# Patient Record
Sex: Male | Born: 1975 | Race: White | Hispanic: No | Marital: Single | Smoking: Current every day smoker
Health system: Southern US, Community
[De-identification: ages and names within clinical notes are randomized; demographics above are authoritative.]

## PROBLEM LIST (undated history)

## (undated) DIAGNOSIS — E039 Hypothyroidism, unspecified: Secondary | ICD-10-CM

## (undated) HISTORY — PX: NO PAST SURGERIES: SHX2092

---

## 1998-03-24 ENCOUNTER — Emergency Department (HOSPITAL_COMMUNITY): Admission: EM | Admit: 1998-03-24 | Discharge: 1998-03-24 | Payer: Self-pay | Admitting: Emergency Medicine

## 1998-03-24 ENCOUNTER — Encounter: Payer: Self-pay | Admitting: Emergency Medicine

## 2015-02-08 ENCOUNTER — Emergency Department (HOSPITAL_COMMUNITY)
Admission: EM | Admit: 2015-02-08 | Discharge: 2015-02-08 | Disposition: A | Discharge status: Home / Self Care | Payer: Self-pay | Attending: Emergency Medicine | Admitting: Emergency Medicine

## 2015-02-08 ENCOUNTER — Encounter (HOSPITAL_COMMUNITY): Payer: Self-pay | Admitting: *Deleted

## 2015-02-08 ENCOUNTER — Emergency Department (INDEPENDENT_AMBULATORY_CARE_PROVIDER_SITE_OTHER)
Admission: EM | Admit: 2015-02-08 | Discharge: 2015-02-08 | Disposition: A | Discharge status: Other Acute Inpatient Hospital | Payer: Self-pay | Source: Home / Self Care | Attending: Family Medicine | Admitting: Family Medicine

## 2015-02-08 ENCOUNTER — Emergency Department (HOSPITAL_BASED_OUTPATIENT_CLINIC_OR_DEPARTMENT_OTHER)
Admit: 2015-02-08 | Discharge: 2015-02-08 | Disposition: A | Discharge status: Home / Self Care | Payer: Self-pay | Attending: Emergency Medicine | Admitting: Emergency Medicine

## 2015-02-08 DIAGNOSIS — R609 Edema, unspecified: Secondary | ICD-10-CM

## 2015-02-08 DIAGNOSIS — I82403 Acute embolism and thrombosis of unspecified deep veins of lower extremity, bilateral: Secondary | ICD-10-CM

## 2015-02-08 DIAGNOSIS — I82811 Embolism and thrombosis of superficial veins of right lower extremities: Secondary | ICD-10-CM

## 2015-02-08 DIAGNOSIS — R600 Localized edema: Secondary | ICD-10-CM | POA: Insufficient documentation

## 2015-02-08 DIAGNOSIS — I82401 Acute embolism and thrombosis of unspecified deep veins of right lower extremity: Secondary | ICD-10-CM | POA: Insufficient documentation

## 2015-02-08 DIAGNOSIS — Z791 Long term (current) use of non-steroidal anti-inflammatories (NSAID): Secondary | ICD-10-CM | POA: Insufficient documentation

## 2015-02-08 DIAGNOSIS — Z720 Tobacco use: Secondary | ICD-10-CM | POA: Insufficient documentation

## 2015-02-08 DIAGNOSIS — M791 Myalgia: Secondary | ICD-10-CM | POA: Insufficient documentation

## 2015-02-08 LAB — I-STAT CHEM 8, ED
BUN: 11 mg/dL (ref 6–20)
Calcium, Ion: 1.14 mmol/L (ref 1.12–1.23)
Chloride: 101 mmol/L (ref 101–111)
Creatinine, Ser: 0.9 mg/dL (ref 0.61–1.24)
Glucose, Bld: 114 mg/dL — ABNORMAL HIGH (ref 65–99)
HCT: 45 % (ref 39.0–52.0)
Hemoglobin: 15.3 g/dL (ref 13.0–17.0)
Potassium: 4.1 mmol/L (ref 3.5–5.1)
Sodium: 138 mmol/L (ref 135–145)
TCO2: 24 mmol/L (ref 0–100)

## 2015-02-08 MED ORDER — ASPIRIN 81 MG PO CHEW: 81.0000 mg | CHEWABLE_TABLET | Freq: Every day | ORAL | Status: DC

## 2015-02-08 NOTE — Progress Notes (Signed)
VASCULAR LAB PRELIMINARY  PRELIMINARY  PRELIMINARY  PRELIMINARY  Bilateral lower extremity venous duplex completed.    Preliminary report:  There is no DVT noted in the bilateral lower extremities.   There is superficial thrombosis noted in the lesser saphenous vein of the right lower extremity, distal to proximal calf.  There is an anechoic area noted in the left popliteal fossa that could possibly be consistent with a rupturing Baker's cyst as there is, what appears to be, significant fluid throughout the calf.  Britania Shreeve, RVT 02/08/2015, 6:30 PM

## 2015-02-08 NOTE — ED Provider Notes (Signed)
CSN: [id number]     Arrival date & time 02/08/15  1655 History  This chart was scribed for Ripley Fraise, MD by Ludger Nutting, ED Scribe. This patient was seen in room TR04C/TR04C and the patient's care was started 5:41 PM.    Chief Complaint  Patient presents with  . Leg Swelling   The history is provided by the patient. No language interpreter was used.     HPI Comments: Matthew Flynn is a 38 y.o. male who presents to the Emergency Department complaining of a few months of intermittent pain and swelling to the bilateral legs, left greater than right. He reports the pain is constant and gradually worsening to the back of the left knee and calf for the past couple of days. He states the pain is worse with bearing weight. He was seen at [facility] and was sent here for Doppler ultrasound testing. He denies fever, vomiting, CP, SOB, abdominal pain. He is a current daily smoker.   History reviewed. No pertinent past medical history. History reviewed. No pertinent past surgical history. History reviewed. No pertinent family history. Social History  Substance Use Topics  . Smoking status: Current Every Day Smoker  . Smokeless tobacco: None  . Alcohol Use: Yes    Review of Systems  Constitutional: Negative for fever.  Respiratory: Negative for shortness of breath.   Cardiovascular: Positive for leg swelling. Negative for chest pain.  Gastrointestinal: Negative for vomiting and abdominal pain.  Musculoskeletal: Positive for myalgias (left calf).  Neurological: Negative for weakness and numbness.    Allergies  Review of patient's allergies indicates no known allergies.  Home Medications   Prior to Admission medications   Medication Sig Start Date End Date Taking? Authorizing Provider  Naproxen Sodium (ALEVE PO) Take by mouth.    Historical Provider, MD   There were no vitals taken for this visit. Physical Exam  Nursing note and vitals reviewed.   CONSTITUTIONAL: Well  developed/well nourished HEAD: Normocephalic/atraumatic EYES: EOMI/PERRL ENMT: Mucous membranes moist NECK: supple no meningeal signs SPINE/BACK:entire spine nontender CV: S1/S2 noted, no murmurs/rubs/gallops noted LUNGS: Lungs are clear to auscultation bilaterally, no apparent distress ABDOMEN: soft, nontender, no rebound or guarding, bowel sounds noted throughout abdomen GU:no cva tenderness NEURO: Pt is awake/alert/appropriate, moves all extremitiesx4.  No facial droop.   EXTREMITIES: pulses normal/equal, full ROM, significant edema and calf tenderness to left leg. Mild edema to right leg. Distal cap refill < 3 seconds  SKIN: warm, color normal PSYCH: no abnormalities of mood noted, alert and oriented to situation  ED Course  Procedures   DIAGNOSTIC STUDIES: Oxygen Saturation is 96% on RA, adequate by my interpretation.    COORDINATION OF CARE: 5:46 PM Will order Doppler ultrasound study and lab work. Discussed treatment plan with pt at bedside and pt agreed to plan.   Labs Review Labs Reviewed  I-STAT CHEM 8, ED - Abnormal; Notable for the following:    Glucose, Bld 114 (*)    All other components within normal limits   No DVT Small superficial VT in right LEG Possible ruptured bakers cyst to left LE Distal pulses intact Pt only has pain in LEFT calf Advised rest, elevate leg and also use of crutches Will start ASA for SVT Given PCP referral   MDM   Final diagnoses:  Peripheral edema  Acute superficial venous thrombosis of lower extremity, right    Nursing notes including past medical history and social history reviewed and considered in documentation Labs/vital reviewed  myself and considered during evaluation   I personally performed the services described in this documentation, which was scribed in my presence. The recorded information has been reviewed and is accurate.       Ripley Fraise, MD 02/08/15 [phone]

## 2015-02-08 NOTE — ED Notes (Signed)
Pt reports having intermittent leg swelling for months. More severe in left leg. Has pain to calf and behind his knees. Went to ucc and sent here to r/o dvt

## 2015-02-08 NOTE — ED Notes (Signed)
Swelling noted to LLE. Warm to touch. Tender to touch. Pulses palpable. Cap refill <3.

## 2015-02-08 NOTE — ED Notes (Signed)
Pt  Has  Swelling  Of  Both  Legs        He  Reports         l           Leg  Is  Worse  Than the  r         He  denys  Any  Injury  He  Reports  Pain is  Worse  On  Weight bearing             He  denys  Any  Chest pain or  Any  Shortness of  Breath

## 2015-02-08 NOTE — ED Provider Notes (Signed)
CSN: [id number]     Arrival date & time 02/08/15  1408 History   First MD Initiated Contact with Patient 02/08/15 1551     Chief Complaint  Patient presents with  . Leg Swelling   (Consider location/radiation/quality/duration/timing/severity/associated sxs/prior Treatment) Patient is a 39 y.o. male presenting with leg pain. The history is provided by the patient.  Leg Pain Location:  Leg Leg location:  L lower leg and R lower leg Pain details:    Quality:  Pressure and cramping   Radiates to:  Does not radiate   Severity:  Moderate (left> right sx.)   Onset quality:  Gradual   Duration:  3 months   Progression:  Worsening Chronicity:  Chronic Dislocation: no   Prior injury to area:  No Associated symptoms: decreased ROM, muscle weakness and swelling     History reviewed. No pertinent past medical history. History reviewed. No pertinent past surgical history. History reviewed. No pertinent family history. Social History  Substance Use Topics  . Smoking status: Current Every Day Smoker  . Smokeless tobacco: None  . Alcohol Use: Yes    Review of Systems  Constitutional: Negative.   Respiratory: Negative.   Cardiovascular: Positive for leg swelling. Negative for chest pain and palpitations.  Gastrointestinal: Negative.     Allergies  Review of patient's allergies indicates no known allergies.  Home Medications   Prior to Admission medications   Medication Sig Start Date End Date Taking? Authorizing Provider  Naproxen Sodium (ALEVE PO) Take by mouth.   Yes Historical Provider, MD   BP 127/69 mmHg  Pulse 83  Temp(Src) 98.8 F (37.1 C) (Oral)  Resp 18  SpO2 96% Physical Exam  Constitutional: He is oriented to person, place, and time. He appears well-developed and well-nourished. No distress.  Cardiovascular: Normal heart sounds and intact distal pulses.   Pulmonary/Chest: Effort normal and breath sounds normal.  Musculoskeletal: He exhibits edema.       Left  lower leg: He exhibits tenderness, swelling and edema.       Legs: Neurological: He is alert and oriented to person, place, and time.  Skin: Skin is warm and dry.  Nursing note and vitals reviewed.   ED Course  Procedures (including critical care time) Labs Review Labs Reviewed - No data to display  Imaging Review No results found.   MDM   1. DVT (deep venous thrombosis), bilateral    Sent for eval of poss dvt , no risk factors except smoking.    Billy Fischer, MD 02/08/15 [phone]

## 2018-11-28 DIAGNOSIS — M25562 Pain in left knee: Secondary | ICD-10-CM | POA: Insufficient documentation

## 2018-11-28 DIAGNOSIS — M25561 Pain in right knee: Secondary | ICD-10-CM | POA: Insufficient documentation

## 2021-06-26 DIAGNOSIS — K4090 Unilateral inguinal hernia, without obstruction or gangrene, not specified as recurrent: Secondary | ICD-10-CM

## 2021-06-26 HISTORY — DX: Unilateral inguinal hernia, without obstruction or gangrene, not specified as recurrent: K40.90

## 2021-07-12 ENCOUNTER — Encounter: Payer: Self-pay | Admitting: Nurse Practitioner

## 2021-07-12 ENCOUNTER — Other Ambulatory Visit: Payer: Self-pay

## 2021-07-12 ENCOUNTER — Ambulatory Visit (INDEPENDENT_AMBULATORY_CARE_PROVIDER_SITE_OTHER): Payer: Managed Care, Other (non HMO) | Admitting: Nurse Practitioner

## 2021-07-12 VITALS — BP 105/67 | HR 80 | Temp 98.0°F | Ht 74.0 in | Wt 191.0 lb

## 2021-07-12 DIAGNOSIS — Z13228 Encounter for screening for other metabolic disorders: Secondary | ICD-10-CM

## 2021-07-12 DIAGNOSIS — Z0000 Encounter for general adult medical examination without abnormal findings: Secondary | ICD-10-CM

## 2021-07-12 DIAGNOSIS — Z1329 Encounter for screening for other suspected endocrine disorder: Secondary | ICD-10-CM

## 2021-07-12 DIAGNOSIS — K4091 Unilateral inguinal hernia, without obstruction or gangrene, recurrent: Secondary | ICD-10-CM | POA: Insufficient documentation

## 2021-07-12 DIAGNOSIS — Z7689 Persons encountering health services in other specified circumstances: Secondary | ICD-10-CM

## 2021-07-12 DIAGNOSIS — Z130 Encounter for screening for diseases of the blood and blood-forming organs and certain disorders involving the immune mechanism: Secondary | ICD-10-CM

## 2021-07-12 DIAGNOSIS — R7989 Other specified abnormal findings of blood chemistry: Secondary | ICD-10-CM | POA: Diagnosis not present

## 2021-07-12 DIAGNOSIS — Z1321 Encounter for screening for nutritional disorder: Secondary | ICD-10-CM

## 2021-07-12 DIAGNOSIS — M26609 Unspecified temporomandibular joint disorder, unspecified side: Secondary | ICD-10-CM | POA: Diagnosis not present

## 2021-07-12 MED ORDER — CYCLOBENZAPRINE HCL 10 MG PO TABS: ORAL_TABLET | ORAL | 1 refills | Status: DC

## 2021-07-12 NOTE — Progress Notes (Signed)
New Patient Office Visit  Subjective:  Patient ID: Matthew Flynn, male    DOB: Feb 24, 1976  Age: 46 y.o. MRN: [id number]  CC:  Chief Complaint  Patient presents with   New Patient (Initial Visit)    HPI Matthew Flynn presents to establish new primary care provider. The patient used to be on testosterone injections. States that he has not had testosterone injection in two months. Was taking them once monthly. States that last testosterone level was probably checked last year.  Has recently had Baker's cyst behind the right knee. He states that this has resolved. He does get routine DOT physicals. Has not had routine, fasting blood work. The patient is fasting.  He states that for past few weeks, the right side of his jaw feels tight. It is difficult for him to clench his teeth first thing in the morning. He denies his teeth. He denies increased levels of stress or clenching his jaw during the day. States that as a child, he used to have problems with jaw popping  Has left inguinal hernia. Currently not bothering him. Gets larger when he heavily exerts himself.   History reviewed. No pertinent past medical history.  History reviewed. No pertinent surgical history.  Family History  Problem Relation Age of Onset   Diabetes Mother    Stroke Maternal Grandmother    Stroke Maternal Grandfather     Social History   Socioeconomic History   Marital status: Single    Spouse name: Not on file   Number of children: Not on file   Years of education: Not on file   Highest education level: Not on file  Occupational History   Not on file  Tobacco Use   Smoking status: Never   Smokeless tobacco: Not on file  Substance and Sexual Activity   Alcohol use: Yes   Drug use: Never   Sexual activity: Yes  Other Topics Concern   Not on file  Social History Narrative   Not on file   Social Determinants of Health   Financial Resource Strain: Not on file  Food Insecurity: Not on file   Transportation Needs: Not on file  Physical Activity: Not on file  Stress: Not on file  Social Connections: Not on file  Intimate Partner Violence: Not on file    ROS Review of Systems  Constitutional:  Positive for fatigue. Negative for activity change, chills and fever.  HENT:  Negative for congestion, postnasal drip, rhinorrhea, sinus pressure, sinus pain, sneezing and sore throat.   Eyes: Negative.   Respiratory:  Negative for cough, shortness of breath and wheezing.   Cardiovascular:  Negative for chest pain and palpitations.  Gastrointestinal:  Negative for constipation, diarrhea, nausea and vomiting.  Endocrine: Negative for cold intolerance, heat intolerance, polydipsia and polyuria.  Genitourinary:  Positive for scrotal swelling and testicular pain. Negative for dysuria, frequency and urgency.       Left inguinal hernia. Will cause pain with extreme exertion. Stretches into left testicular area which becomes very tender if inguinal hernia becomes inflamed.   Musculoskeletal:  Negative for back pain and myalgias.  Skin:  Negative for rash.  Allergic/Immunologic: Negative for environmental allergies.  Neurological:  Negative for dizziness, weakness and headaches.  Psychiatric/Behavioral:  The patient is not nervous/anxious.    Objective:   Today's Vitals   07/12/21 0846  BP: 105/67  Pulse: 80  Temp: 98 F (36.7 C)  SpO2: 97%  Weight: 191 lb (86.6 kg)  Height:  6' 2"  (1.88 m)   Body mass index is 24.52 kg/m.   Physical Exam Vitals and nursing note reviewed.  Constitutional:      Appearance: Normal appearance. He is well-developed.  HENT:     Head: Normocephalic and atraumatic.     Jaw: Tenderness, pain on movement and malocclusion present.     Nose: Nose normal.     Mouth/Throat:     Mouth: Mucous membranes are moist.     Pharynx: Oropharynx is clear.  Eyes:     Pupils: Pupils are equal, round, and reactive to light.  Cardiovascular:     Rate and Rhythm:  Normal rate and regular rhythm.     Pulses: Normal pulses.     Heart sounds: Normal heart sounds.  Pulmonary:     Effort: Pulmonary effort is normal.     Breath sounds: Normal breath sounds.  Abdominal:     General: Bowel sounds are normal.     Palpations: Abdomen is soft.     Tenderness: There is no abdominal tenderness.     Hernia: A hernia is present. Hernia is present in the left inguinal area.  Genitourinary:   Musculoskeletal:        General: Normal range of motion.     Cervical back: Normal range of motion and neck supple.  Lymphadenopathy:     Cervical: No cervical adenopathy.  Skin:    General: Skin is warm and dry.     Capillary Refill: Capillary refill takes less than 2 seconds.  Neurological:     General: No focal deficit present.     Mental Status: He is alert and oriented to person, place, and time.  Psychiatric:        Mood and Affect: Mood normal.        Behavior: Behavior normal.        Thought Content: Thought content normal.        Judgment: Judgment normal.    Assessment & Plan:  1. Encounter to establish care Appointment today to establish new primary care provider . Will get records from previous primary care provider to review and update patient chart.   2. Unilateral recurrent inguinal hernia without obstruction or gangrene Large left inguinal hernia present. Consider referral to general surgery at next visit   3. TMJ (temporomandibular joint disorder) Trial flexeril - take at bedtime as needed for muscle pain and spasms. Advised him to take only at bedtime. He should not drive or work after taking this medication as it will likely cause drowsiness and dizziness. He voiced understanding and agreement.  - cyclobenzaprine (FLEXERIL) 10 MG tablet; Take 1/2 to 1 tablet po QHS prn TMJ  Dispense: 30 tablet; Refill: 1  4. Low testosterone in male Check testosteron and prolactin levels for further evaluation. Will treat as indicated.  - Testosterone;  Future - Prolactin; Future - Testosterone - Prolactin  5. Screening for endocrine, nutritional, metabolic and immunity disorder Routine, fasting labs drawn during today's visit  - CBC; Future - Lipid panel; Future - TSH; Future - Comp Met (CMET); Future - Testosterone; Future - Hemoglobin A1c; Future - Prolactin; Future - Hemoglobin A1c - Testosterone - Comp Met (CMET) - TSH - Lipid panel - CBC - Prolactin  6. Health care maintenance Routine, fasting labs drawn during today's visit  - CBC; Future - Lipid panel; Future - TSH; Future - Comp Met (CMET); Future - Hemoglobin A1c; Future - Hemoglobin A1c - Comp Met (CMET) - TSH - Lipid panel -  CBC    Problem List Items Addressed This Visit       Musculoskeletal and Integument   TMJ (temporomandibular joint disorder)   Relevant Medications   cyclobenzaprine (FLEXERIL) 10 MG tablet     Other   Unilateral recurrent inguinal hernia without obstruction or gangrene   Low testosterone in male   Relevant Orders   Testosterone   Prolactin   Other Visit Diagnoses     Encounter to establish care    -  Primary   Screening for endocrine, nutritional, metabolic and immunity disorder       Relevant Orders   CBC   Lipid panel   TSH   Comp Met (CMET)   Testosterone   Hemoglobin A1c   Prolactin   Health care maintenance       Relevant Orders   CBC   Lipid panel   TSH   Comp Met (CMET)   Hemoglobin A1c       Outpatient Encounter Medications as of 07/12/2021  Medication Sig   cyclobenzaprine (FLEXERIL) 10 MG tablet Take 1/2 to 1 tablet po QHS prn TMJ   [DISCONTINUED] aspirin 81 MG chewable tablet Chew 1 tablet (81 mg total) by mouth daily. (Patient not taking: Reported on 07/12/2021)   [DISCONTINUED] Naproxen Sodium (ALEVE PO) Take by mouth. (Patient not taking: Reported on 07/12/2021)   No facility-administered encounter medications on file as of 07/12/2021.    Follow-up: Return in about 3 weeks (around 08/02/2021)  for health maintenance exam  - can we get records from [facility]? thanks .   Ronnell Freshwater, NP

## 2021-07-13 LAB — TESTOSTERONE: Testosterone: 371 ng/dL (ref 264–916)

## 2021-07-13 LAB — COMPREHENSIVE METABOLIC PANEL
ALT: 16 IU/L (ref 0–44)
AST: 14 IU/L (ref 0–40)
Albumin/Globulin Ratio: 2.1 (ref 1.2–2.2)
Albumin: 4.5 g/dL (ref 4.0–5.0)
Alkaline Phosphatase: 70 IU/L (ref 44–121)
BUN/Creatinine Ratio: 13 (ref 9–20)
BUN: 11 mg/dL (ref 6–24)
Bilirubin Total: 0.2 mg/dL (ref 0.0–1.2)
CO2: 24 mmol/L (ref 20–29)
Calcium: 9.3 mg/dL (ref 8.7–10.2)
Chloride: 101 mmol/L (ref 96–106)
Creatinine, Ser: 0.88 mg/dL (ref 0.76–1.27)
Globulin, Total: 2.1 g/dL (ref 1.5–4.5)
Glucose: 96 mg/dL (ref 70–99)
Potassium: 4.2 mmol/L (ref 3.5–5.2)
Sodium: 139 mmol/L (ref 134–144)
Total Protein: 6.6 g/dL (ref 6.0–8.5)
eGFR: 109 mL/min/{1.73_m2} (ref >59)

## 2021-07-13 LAB — CBC
Hematocrit: 44.5 % (ref 37.5–51.0)
Hemoglobin: 14.8 g/dL (ref 13.0–17.7)
MCH: 30.6 pg (ref 26.6–33.0)
MCHC: 33.3 g/dL (ref 31.5–35.7)
MCV: 92 fL (ref 79–97)
Platelets: 245 10*3/uL (ref 150–450)
RBC: 4.84 x10E6/uL (ref 4.14–5.80)
RDW: 12.9 % (ref 11.6–15.4)
WBC: 9.3 10*3/uL (ref 3.4–10.8)

## 2021-07-13 LAB — TSH: TSH: 5.54 u[IU]/mL — ABNORMAL HIGH (ref 0.450–4.500)

## 2021-07-13 LAB — LIPID PANEL
Chol/HDL Ratio: 3.8 ratio (ref 0.0–5.0)
Cholesterol, Total: 162 mg/dL (ref 100–199)
HDL: 43 mg/dL (ref >39)
LDL Chol Calc (NIH): 83 mg/dL (ref 0–99)
Triglycerides: 218 mg/dL — ABNORMAL HIGH (ref 0–149)
VLDL Cholesterol Cal: 36 mg/dL (ref 5–40)

## 2021-07-13 LAB — PROLACTIN: Prolactin: 20.8 ng/mL — ABNORMAL HIGH (ref 4.0–15.2)

## 2021-07-13 LAB — HEMOGLOBIN A1C
Est. average glucose Bld gHb Est-mCnc: 114 mg/dL
Hgb A1c MFr Bld: 5.6 % (ref 4.8–5.6)

## 2021-07-13 NOTE — Progress Notes (Signed)
Elevated TSH and elevated prolactin levels with increased fatigue and decreased libido. Will discuss with patient. Will need to be referred to endocrinology. Discuss MRI. Next visit 08/02/2021

## 2021-08-02 ENCOUNTER — Other Ambulatory Visit: Payer: Self-pay

## 2021-08-02 ENCOUNTER — Encounter: Payer: Self-pay | Admitting: Nurse Practitioner

## 2021-08-02 ENCOUNTER — Ambulatory Visit (INDEPENDENT_AMBULATORY_CARE_PROVIDER_SITE_OTHER): Payer: Managed Care, Other (non HMO) | Admitting: Nurse Practitioner

## 2021-08-02 VITALS — BP 101/65 | HR 97 | Temp 98.3°F | Ht 74.0 in | Wt 195.0 lb

## 2021-08-02 DIAGNOSIS — R7989 Other specified abnormal findings of blood chemistry: Secondary | ICD-10-CM | POA: Diagnosis not present

## 2021-08-02 DIAGNOSIS — E039 Hypothyroidism, unspecified: Secondary | ICD-10-CM | POA: Insufficient documentation

## 2021-08-02 DIAGNOSIS — Z0001 Encounter for general adult medical examination with abnormal findings: Secondary | ICD-10-CM | POA: Diagnosis not present

## 2021-08-02 DIAGNOSIS — K4091 Unilateral inguinal hernia, without obstruction or gangrene, recurrent: Secondary | ICD-10-CM | POA: Diagnosis not present

## 2021-08-02 DIAGNOSIS — E663 Overweight: Secondary | ICD-10-CM

## 2021-08-02 DIAGNOSIS — M26609 Unspecified temporomandibular joint disorder, unspecified side: Secondary | ICD-10-CM

## 2021-08-02 MED ORDER — LEVOTHYROXINE SODIUM 25 MCG PO TABS
25.0000 ug | ORAL_TABLET | Freq: Every day | ORAL | 3 refills | Status: DC
Start: 2021-08-02 — End: 2021-11-07

## 2021-08-02 NOTE — Progress Notes (Signed)
Established patient visit   Patient: Matthew Flynn   DOB: January 19, 1976   46 y.o. Male  MRN: [id number] Visit Date: 08/02/2021   Chief Complaint  Patient presents with   Annual Exam   Subjective    HPI  The patient presents for annual wellness visit. Patient states that previous provider was proscribing testosterone injections. Checked testosterone levels. These were normal. Prolactin was also checked and was elevated.  Has large left inguinal hernia which descends into the left testicle. Will get tender and protrude when he is exerting himself. Feels as though this is getting larger.  Had routine, fasting labs prior to today's visit. TSH was mildly elevated at 5.540. triglycerides were also elevated, with the remainder of the lipid panel being normal.  He has no new concerns or complaints today.    Medications: Outpatient Medications Prior to Visit  Medication Sig   cyclobenzaprine (FLEXERIL) 10 MG tablet Take 1/2 to 1 tablet po QHS prn TMJ (Patient not taking: Reported on 08/02/2021)   No facility-administered medications prior to visit.    Review of Systems  Constitutional:  Positive for fatigue. Negative for activity change, chills and fever.  HENT:  Negative for congestion, postnasal drip, rhinorrhea, sinus pressure, sinus pain, sneezing and sore throat.   Eyes: Negative.   Respiratory:  Negative for cough, shortness of breath and wheezing.   Cardiovascular:  Negative for chest pain and palpitations.  Gastrointestinal:  Negative for constipation, diarrhea, nausea and vomiting.  Endocrine: Negative for cold intolerance, heat intolerance, polydipsia and polyuria.  Genitourinary:  Negative for dysuria, frequency and urgency.        Left inguinal hernia. Will cause pain with extreme exertion. Stretches into left testicular area which becomes very tender if inguinal hernia becomes inflamed.    Musculoskeletal:  Negative for back pain and myalgias.  Skin:  Negative for rash.   Allergic/Immunologic: Negative for environmental allergies.  Neurological:  Negative for dizziness, weakness and headaches.  Psychiatric/Behavioral:  The patient is not nervous/anxious.    Last CBC Lab Results  Component Value Date   WBC 9.3 07/12/2021   HGB 14.8 07/12/2021   HCT 44.5 07/12/2021   MCV 92 07/12/2021   MCH 30.6 07/12/2021   RDW 12.9 07/12/2021   PLT 245 62/13/0865   Last metabolic panel Lab Results  Component Value Date   GLUCOSE 96 07/12/2021   NA 139 07/12/2021   K 4.2 07/12/2021   CL 101 07/12/2021   CO2 24 07/12/2021   BUN 11 07/12/2021   CREATININE 0.88 07/12/2021   EGFR 109 07/12/2021   CALCIUM 9.3 07/12/2021   PROT 6.6 07/12/2021   ALBUMIN 4.5 07/12/2021   LABGLOB 2.1 07/12/2021   AGRATIO 2.1 07/12/2021   BILITOT 0.2 07/12/2021   ALKPHOS 70 07/12/2021   AST 14 07/12/2021   ALT 16 07/12/2021   Last lipids Lab Results  Component Value Date   CHOL 162 07/12/2021   HDL 43 07/12/2021   LDLCALC 83 07/12/2021   TRIG 218 (H) 07/12/2021   CHOLHDL 3.8 07/12/2021   Last hemoglobin A1c Lab Results  Component Value Date   HGBA1C 5.6 07/12/2021   Last thyroid functions Lab Results  Component Value Date   TSH 5.540 (H) 07/12/2021       Objective     Today's Vitals   08/02/21 1537  BP: 101/65  Pulse: 97  Temp: 98.3 F (36.8 C)  SpO2: 97%  Weight: 195 lb (88.5 kg)  Height: 6' 2"  (1.88 m)  Body mass index is 25.04 kg/m.   BP Readings from Last 3 Encounters:  08/02/21 101/65  07/12/21 105/67  02/08/15 113/71    Wt Readings from Last 3 Encounters:  08/02/21 195 lb (88.5 kg)  07/12/21 191 lb (86.6 kg)    Physical Exam Vitals and nursing note reviewed.  Constitutional:      Appearance: Normal appearance. He is well-developed.  HENT:     Head: Normocephalic and atraumatic.     Jaw: Tenderness, pain on movement and malocclusion present.     Right Ear: Tympanic membrane, ear canal and external ear normal.     Left Ear: Tympanic  membrane, ear canal and external ear normal.     Nose: Nose normal.     Mouth/Throat:     Mouth: Mucous membranes are moist.     Pharynx: Oropharynx is clear.  Eyes:     Extraocular Movements: Extraocular movements intact.     Conjunctiva/sclera: Conjunctivae normal.     Pupils: Pupils are equal, round, and reactive to light.  Cardiovascular:     Rate and Rhythm: Normal rate and regular rhythm.     Pulses: Normal pulses.     Heart sounds: Normal heart sounds.  Pulmonary:     Effort: Pulmonary effort is normal.     Breath sounds: Normal breath sounds.  Abdominal:     General: Bowel sounds are normal. There is no distension.     Palpations: Abdomen is soft. There is no mass.     Tenderness: There is no abdominal tenderness. There is no guarding or rebound.     Hernia: A hernia is present. Hernia is present in the left inguinal area.  Genitourinary:   Musculoskeletal:        General: Normal range of motion.     Cervical back: Normal range of motion and neck supple.  Lymphadenopathy:     Cervical: No cervical adenopathy.  Skin:    General: Skin is warm and dry.     Capillary Refill: Capillary refill takes less than 2 seconds.  Neurological:     General: No focal deficit present.     Mental Status: He is alert and oriented to person, place, and time.  Psychiatric:        Mood and Affect: Mood normal.        Behavior: Behavior normal.        Thought Content: Thought content normal.        Judgment: Judgment normal.      Assessment & Plan    1. Encounter for general adult medical examination with abnormal findings Annual wellness visit today   2. Unilateral recurrent inguinal hernia without obstruction or gangrene Large left inguinal hernia which stretches into the left scrotum. Will get ultrasound of the left pelvis and left scrotum for further evaluation. Refer to surgery/urology as indicated.  - US Pelvis Limited; Future - US Scrotum; Future  3. Elevated prolactin  level Refer to endocrinology for further evaluation.  - Ambulatory referral to Endocrinology  4. Acquired hypothyroidism Start levothyroxine 37mg daily. Recheck thyroid panel in three months, prior to next visit and adjust dosing as indicated  - levothyroxine (SYNTHROID) 25 MCG tablet; Take 1 tablet (25 mcg total) by mouth daily.  Dispense: 90 tablet; Refill: 3  5. TMJ (temporomandibular joint disorder) Resolved.    Problem List Items Addressed This Visit       Endocrine   Acquired hypothyroidism   Relevant Medications   levothyroxine (SYNTHROID) 25 MCG tablet  Musculoskeletal and Integument   TMJ (temporomandibular joint disorder)     Other   Unilateral recurrent inguinal hernia without obstruction or gangrene   Relevant Orders   US Pelvis Limited   US Scrotum   Elevated prolactin level   Relevant Orders   Ambulatory referral to Endocrinology   Other Visit Diagnoses     Encounter for general adult medical examination with abnormal findings    -  Primary        Return in about 3 months (around 10/30/2021) for hypothyroid. check TSH and Fee T4 a week prior to visit. Marland Kitchen         Ronnell Freshwater, NP  [facility] at [facility] [phone] (phone) [phone] (fax)  [place]

## 2021-08-09 ENCOUNTER — Ambulatory Visit
Admission: RE | Admit: 2021-08-09 | Discharge: 2021-08-09 | Disposition: A | Discharge status: Home / Self Care | Payer: Managed Care, Other (non HMO) | Source: Ambulatory Visit | Attending: Nurse Practitioner | Admitting: Nurse Practitioner

## 2021-08-09 DIAGNOSIS — K4091 Unilateral inguinal hernia, without obstruction or gangrene, recurrent: Secondary | ICD-10-CM

## 2021-08-10 ENCOUNTER — Other Ambulatory Visit: Payer: Self-pay | Admitting: Nurse Practitioner

## 2021-08-10 DIAGNOSIS — K4091 Unilateral inguinal hernia, without obstruction or gangrene, recurrent: Secondary | ICD-10-CM

## 2021-08-10 NOTE — Progress Notes (Signed)
Referral placed to [facility] surgery for further evaluation of large, fat-containing inguinal hernia.

## 2021-08-10 NOTE — Progress Notes (Signed)
Please let the patient know that ultrasounds of the pelvis/scrotum show large, fat containing inguinal hernia. I have placed a referral into [facility] for consultation.  Thanks so much.   -HB

## 2021-08-10 NOTE — Progress Notes (Signed)
Referral placed to [place] [facility] due to large, fat containing inguinal hernia which descends into the left scrotum.

## 2021-09-28 ENCOUNTER — Encounter: Payer: Self-pay | Admitting: "Endocrinology

## 2021-09-28 ENCOUNTER — Ambulatory Visit: Payer: Managed Care, Other (non HMO) | Admitting: "Endocrinology

## 2021-09-28 VITALS — BP 93/78 | HR 64 | Ht 74.0 in | Wt 189.2 lb

## 2021-09-28 DIAGNOSIS — E221 Hyperprolactinemia: Secondary | ICD-10-CM | POA: Diagnosis not present

## 2021-09-28 DIAGNOSIS — E039 Hypothyroidism, unspecified: Secondary | ICD-10-CM

## 2021-09-28 NOTE — Progress Notes (Signed)
? ?    Endocrinology Consult Note ?                                           09/28/2021, 1:32 PM ? ? ?Subjective:  ? ? Patient ID: Matthew Flynn, male    DOB: Apr 01, 1976, PCP Ronnell Freshwater, NP ? ? ?History reviewed. No pertinent past medical history. ?History reviewed. No pertinent surgical history. ?Social History  ? ?Socioeconomic History  ? Marital status: Single  ?  Spouse name: Not on file  ? Number of children: Not on file  ? Years of education: Not on file  ? Highest education level: Not on file  ?Occupational History  ? Not on file  ?Tobacco Use  ? Smoking status: Every Day  ?  Types: Cigarettes  ? Smokeless tobacco: Not on file  ?Vaping Use  ? Vaping Use: Never used  ?Substance and Sexual Activity  ? Alcohol use: Yes  ? Drug use: Never  ? Sexual activity: Yes  ?Other Topics Concern  ? Not on file  ?Social History Narrative  ? Not on file  ? ?Social Determinants of Health  ? ?Financial Resource Strain: Not on file  ?Food Insecurity: Not on file  ?Transportation Needs: Not on file  ?Physical Activity: Not on file  ?Stress: Not on file  ?Social Connections: Not on file  ? ?Family History  ?Problem Relation Age of Onset  ? Diabetes Mother   ? Cancer Father   ? Stroke Maternal Grandmother   ? Stroke Maternal Grandfather   ? ?Outpatient Encounter Medications as of 09/28/2021  ?Medication Sig  ? levothyroxine (SYNTHROID) 25 MCG tablet Take 1 tablet (25 mcg total) by mouth daily.  ? [DISCONTINUED] cyclobenzaprine (FLEXERIL) 10 MG tablet Take 1/2 to 1 tablet po QHS prn TMJ (Patient not taking: Reported on 08/02/2021)  ? ?No facility-administered encounter medications on file as of 09/28/2021.  ? ?ALLERGIES: ?Allergies  ?Allergen Reactions  ? Honey Bee Venom   ? ? ?VACCINATION STATUS: ? ?There is no immunization history on file for this patient. ? ?HPI ?Matthew Flynn is 46 y.o. male who presents today with a medical history as above. he is being seen in consultation for hyperprolactinemia  requested by Ronnell Freshwater, NP.  ?He denies any prior hx of pituitary dysfunction. While undergoing work up for fatigue he was found to have a slight elevation of PRL to 20.9 and elevated TSh of 5.54 in January 2023. ?He was initiated on Levothyroxine 25 mcg po qam. He denies any hx of head injury. He denies galactorrhea. ?He is not on any other meds. He is a chronic active smoker 25PY and drinks 5-6 drinks weekly. ? ?Review of Systems ? ?Constitutional: no recent weight gain/loss, no fatigue, no subjective hyperthermia, no subjective hypothermia ?Eyes: no blurry vision, no xerophthalmia ?ENT: no sore throat, no nodules palpated in throat, no dysphagia/odynophagia, no hoarseness ?Cardiovascular: no Chest Pain, no Shortness of Breath, no palpitations, no leg swelling ?Respiratory: no cough, no shortness of breath ?Gastrointestinal: no Nausea/Vomiting/Diarhhea ?Musculoskeletal: no muscle/joint aches ?Skin: no rashes ?Neurological: no tremors, no numbness, no tingling, no dizziness ?Psychiatric: no depression, no anxiety ? ?Objective:  ?  ? ?  09/28/2021  ?  9:44 AM 08/02/2021  ?  3:37 PM 07/12/2021  ?  8:46 AM  ?Vitals with BMI  ?Height 6' 2"  6' 2"  6'  2"  ?Weight 189 lbs 3 oz 195 lbs 191 lbs  ?BMI 24.28 25.03 24.51  ?Systolic 93 725 366  ?Diastolic 78 65 67  ?Pulse 64 97 80  ? ? ?BP 93/78   Pulse 64   Ht 6' 2"  (1.88 m)   Wt 189 lb 3.2 oz (85.8 kg)   BMI 24.29 kg/m?   ?Wt Readings from Last 3 Encounters:  ?09/28/21 189 lb 3.2 oz (85.8 kg)  ?08/02/21 195 lb (88.5 kg)  ?07/12/21 191 lb (86.6 kg)  ?  ?Physical Exam ? ?Constitutional:  Body mass index is 24.29 kg/m?.,  not in acute distress, normal state of mind ?Eyes: PERRLA, EOMI, no exophthalmos ?ENT: moist mucous membranes, no gross thyromegaly, no gross cervical lymphadenopathy ?Cardiovascular: normal precordial activity, Regular Rate and Rhythm, no Murmur/Rubs/Gallops ?Respiratory:  adequate breathing efforts, no gross chest deformity, Clear to auscultation  bilaterally ?Gastrointestinal: abdomen soft, Non -tender, No distension, Bowel Sounds present, no gross organomegaly ?Musculoskeletal: no gross deformities, strength intact in all four extremities ?Skin: moist, warm, no rashes ?Neurological: no tremor with outstretched hands, Deep tendon reflexes normal in bilateral lower extremities. ? ?CMP ( most recent) ?CMP  ?   ?Component Value Date/Time  ? NA 139 07/12/2021 0911  ? K 4.2 07/12/2021 0911  ? CL 101 07/12/2021 0911  ? CO2 24 07/12/2021 0911  ? GLUCOSE 96 07/12/2021 0911  ? GLUCOSE 114 (H) 02/08/2015 1757  ? BUN 11 07/12/2021 0911  ? CREATININE 0.88 07/12/2021 0911  ? CALCIUM 9.3 07/12/2021 0911  ? PROT 6.6 07/12/2021 0911  ? ALBUMIN 4.5 07/12/2021 0911  ? AST 14 07/12/2021 0911  ? ALT 16 07/12/2021 0911  ? ALKPHOS 70 07/12/2021 0911  ? BILITOT 0.2 07/12/2021 0911  ? ? ? ?Diabetic Labs (most recent): ?Lab Results  ?Component Value Date  ? HGBA1C 5.6 07/12/2021  ? ? ? Lipid Panel ( most recent) ?Lipid Panel  ?   ?Component Value Date/Time  ? CHOL 162 07/12/2021 0911  ? TRIG 218 (H) 07/12/2021 0911  ? HDL 43 07/12/2021 0911  ? CHOLHDL 3.8 07/12/2021 0911  ? [place] 83 07/12/2021 0911  ? LABVLDL 36 07/12/2021 0911  ? ?  ? ?Lab Results  ?Component Value Date  ? TSH 5.540 (H) 07/12/2021  ?  ?PRL 20.8 ?   ? ? ? ?Assessment & Plan:  ? ?1. Hyperprolactinemia ([place]) ?2. Hypothyroidism, unspecified type ? ? ?- Matthew Flynn  is being seen at a kind request of Ronnell Freshwater, NP. ?- I have reviewed his available endocrine records and clinically evaluated the patient. ?- Based on these reviews, he has mild hypothyroidism for which he is appropriately on low dose levothyroxine 25 mcg po qam. ?He is advised to continue same dose for now. ? ? - We discussed about the correct intake of his thyroid hormone, on empty stomach at fasting, with water, separated by at least 30 minutes from breakfast and other medications,  and separated by more than 4 hours from calcium, iron,  multivitamins, acid reflux medications (PPIs). ?-Patient is made aware of the fact that thyroid hormone replacement is needed for life, dose to be adjusted by periodic monitoring of thyroid function tests. ? ?Regarding his mild elevation of PRL, he will not need intervention for now. Primary hypothyroidism can cause hyperprolactinemia. ?He will have repeat labs for PRL, and a full set TFTs before his next visit in 9 weeks. ?If he returns with higher levels of PRL, he will be considered for  MRI sella/pituitary. ? ?The patient was counseled on the dangers of tobacco use, and was advised to quit.  Reviewed strategies to maximize success, including removing cigarettes and smoking materials from environment. ? ? ?- I did not initiate any new prescriptions today. ?- he is advised to maintain close follow up with Ronnell Freshwater, NP for primary care needs. ? ? ?- Time spent with the patient: 50 minutes, of which >50% was spent in  counseling him about his hyperprolactinemia, hypothyroidism and the rest in obtaining information about his symptoms, reviewing his previous labs/studies ( including abstractions from other facilities),  evaluations, and treatments,  and developing a plan to confirm diagnosis and long term treatment based on the latest standards of care/guidelines; and documenting his care. ? ?KAIMANI CLAYSON participated in the discussions, expressed understanding, and voiced agreement with the above plans.  All questions were answered to his satisfaction. he is encouraged to contact clinic should he have any questions or concerns prior to his return visit. ? ?Follow up plan: ?Return in about 9 weeks (around 11/30/2021) for F/U with Pre-visit Labs. ? ? ?Glade Lloyd, MD ?[place] ?[facility] ?[address] ?[place], [place] [address] ?Phone: [phone]  Fax: [phone]   ? ? ?09/28/2021, 1:32 PM ? ?This note was partially dictated with voice recognition software.  Similar sounding words can be transcribed inadequately or may not  be corrected upon review. ? ?

## 2021-10-21 ENCOUNTER — Other Ambulatory Visit: Payer: Self-pay | Admitting: Nurse Practitioner

## 2021-10-21 DIAGNOSIS — E039 Hypothyroidism, unspecified: Secondary | ICD-10-CM

## 2021-10-24 ENCOUNTER — Other Ambulatory Visit: Payer: Managed Care, Other (non HMO)

## 2021-10-31 ENCOUNTER — Ambulatory Visit: Payer: Managed Care, Other (non HMO) | Admitting: Nurse Practitioner

## 2021-11-01 ENCOUNTER — Other Ambulatory Visit: Payer: Commercial Managed Care - HMO

## 2021-11-01 DIAGNOSIS — E039 Hypothyroidism, unspecified: Secondary | ICD-10-CM

## 2021-11-02 LAB — TSH: TSH: 2.87 u[IU]/mL (ref 0.450–4.500)

## 2021-11-02 LAB — T3: T3, Total: 124 ng/dL (ref 71–180)

## 2021-11-02 LAB — T4, FREE: Free T4: 1.46 ng/dL (ref 0.82–1.77)

## 2021-11-02 NOTE — Progress Notes (Signed)
Please let the patient know that repeat of thyroid labs was normal. Thanks so much.   -HB

## 2021-11-07 ENCOUNTER — Ambulatory Visit (INDEPENDENT_AMBULATORY_CARE_PROVIDER_SITE_OTHER): Payer: Commercial Managed Care - HMO | Admitting: Nurse Practitioner

## 2021-11-07 ENCOUNTER — Encounter: Payer: Self-pay | Admitting: Nurse Practitioner

## 2021-11-07 VITALS — BP 103/60 | HR 82 | Temp 97.1°F | Ht 74.02 in | Wt 186.8 lb

## 2021-11-07 DIAGNOSIS — Z1211 Encounter for screening for malignant neoplasm of colon: Secondary | ICD-10-CM | POA: Diagnosis not present

## 2021-11-07 DIAGNOSIS — R7989 Other specified abnormal findings of blood chemistry: Secondary | ICD-10-CM | POA: Diagnosis not present

## 2021-11-07 DIAGNOSIS — K4091 Unilateral inguinal hernia, without obstruction or gangrene, recurrent: Secondary | ICD-10-CM

## 2021-11-07 DIAGNOSIS — E039 Hypothyroidism, unspecified: Secondary | ICD-10-CM | POA: Diagnosis not present

## 2021-11-07 MED ORDER — LEVOTHYROXINE SODIUM 25 MCG PO TABS: 25.0000 ug | ORAL_TABLET | Freq: Every day | ORAL | 3 refills | Status: DC

## 2021-11-07 NOTE — Progress Notes (Signed)
Established patient visit   Patient: Matthew Flynn   DOB: July 05, 1975   46 y.o. Male  MRN: [id number] Visit Date: 11/07/2021   Chief Complaint  Patient presents with   Follow-up   Hypothyroidism   Subjective    HPI  -Patient with hypothyroid. Recent thyroid panel normal.  -U/s pelvis/scrotum showed a large, fat containing inguinal hernia extending into the scrotum. Had attempted multiple times to reach him regarding surgical referral, but provider's office had been unable to reach him to schedule.  -no new concerns or complaints at this time.    Medications: Outpatient Medications Prior to Visit  Medication Sig   [DISCONTINUED] levothyroxine (SYNTHROID) 25 MCG tablet Take 1 tablet (25 mcg total) by mouth daily.   No facility-administered medications prior to visit.    Review of Systems  Constitutional:  Negative for activity change, chills, fatigue and fever.  HENT:  Negative for congestion, postnasal drip, rhinorrhea, sinus pressure, sinus pain, sneezing and sore throat.   Eyes: Negative.   Respiratory:  Negative for cough, shortness of breath and wheezing.   Cardiovascular:  Negative for chest pain and palpitations.  Gastrointestinal:  Negative for constipation, diarrhea, nausea and vomiting.  Endocrine: Negative for cold intolerance, heat intolerance, polydipsia and polyuria.       History of hypothyroid with current panel normal.   Genitourinary:  Negative for dysuria, frequency and urgency.       Left inguinal hernia. Will cause pain with extreme exertion. Stretches into left testicular area which becomes very tender if inguinal hernia becomes inflamed.   Musculoskeletal:  Negative for back pain and myalgias.  Skin:  Negative for rash.  Allergic/Immunologic: Negative for environmental allergies.  Neurological:  Negative for dizziness, weakness and headaches.  Psychiatric/Behavioral:  The patient is not nervous/anxious.    Last CBC Lab Results  Component Value Date    WBC 9.3 07/12/2021   HGB 14.8 07/12/2021   HCT 44.5 07/12/2021   MCV 92 07/12/2021   MCH 30.6 07/12/2021   RDW 12.9 07/12/2021   PLT 245 70/48/8891   Last metabolic panel Lab Results  Component Value Date   GLUCOSE 96 07/12/2021   NA 139 07/12/2021   K 4.2 07/12/2021   CL 101 07/12/2021   CO2 24 07/12/2021   BUN 11 07/12/2021   CREATININE 0.88 07/12/2021   EGFR 109 07/12/2021   CALCIUM 9.3 07/12/2021   PROT 6.6 07/12/2021   ALBUMIN 4.5 07/12/2021   LABGLOB 2.1 07/12/2021   AGRATIO 2.1 07/12/2021   BILITOT 0.2 07/12/2021   ALKPHOS 70 07/12/2021   AST 14 07/12/2021   ALT 16 07/12/2021   Last lipids Lab Results  Component Value Date   CHOL 162 07/12/2021   HDL 43 07/12/2021   LDLCALC 83 07/12/2021   TRIG 218 (H) 07/12/2021   CHOLHDL 3.8 07/12/2021   Last hemoglobin A1c Lab Results  Component Value Date   HGBA1C 5.6 07/12/2021   Last thyroid functions Lab Results  Component Value Date   TSH 2.870 11/01/2021   T3TOTAL 124 11/01/2021       Objective     Today's Vitals   11/07/21 1612  BP: 103/60  Pulse: 82  Temp: (!) 97.1 F (36.2 C)  SpO2: 96%  Weight: 186 lb 12.8 oz (84.7 kg)  Height: 6' 2.02" (1.88 m)   Body mass index is 23.97 kg/m.   BP Readings from Last 3 Encounters:  11/07/21 103/60  09/28/21 93/78  08/02/21 101/65    Wt Readings from  Last 3 Encounters:  11/07/21 186 lb 12.8 oz (84.7 kg)  09/28/21 189 lb 3.2 oz (85.8 kg)  08/02/21 195 lb (88.5 kg)    Physical Exam Vitals and nursing note reviewed.  Constitutional:      Appearance: Normal appearance. He is well-developed.  HENT:     Head: Normocephalic and atraumatic.     Jaw: Tenderness, pain on movement and malocclusion present.     Nose: Nose normal.     Mouth/Throat:     Mouth: Mucous membranes are moist.     Pharynx: Oropharynx is clear.  Eyes:     Extraocular Movements: Extraocular movements intact.     Conjunctiva/sclera: Conjunctivae normal.     Pupils: Pupils are  equal, round, and reactive to light.  Cardiovascular:     Rate and Rhythm: Normal rate and regular rhythm.     Pulses: Normal pulses.     Heart sounds: Normal heart sounds.  Pulmonary:     Effort: Pulmonary effort is normal.     Breath sounds: Normal breath sounds.  Abdominal:     General: Bowel sounds are normal. There is no distension.     Palpations: Abdomen is soft. There is no mass.     Tenderness: There is no abdominal tenderness. There is no guarding or rebound.     Hernia: A hernia is present. Hernia is present in the left inguinal area.  Genitourinary:   Musculoskeletal:        General: Normal range of motion.     Cervical back: Normal range of motion and neck supple.  Lymphadenopathy:     Cervical: No cervical adenopathy.  Skin:    General: Skin is warm and dry.     Capillary Refill: Capillary refill takes less than 2 seconds.  Neurological:     General: No focal deficit present.     Mental Status: He is alert and oriented to person, place, and time.  Psychiatric:        Mood and Affect: Mood normal.        Behavior: Behavior normal.        Thought Content: Thought content normal.        Judgment: Judgment normal.     Assessment & Plan    1. Acquired hypothyroidism Recent thyroid panel within normal limits.  Continue levothyroxine 25 mcg daily. - levothyroxine (SYNTHROID) 25 MCG tablet; Take 1 tablet (25 mcg total) by mouth daily.  Dispense: 90 tablet; Refill: 3  2. Unilateral recurrent inguinal hernia without obstruction or gangrene Patient will be given contact information for general surgeon.  Patient to contact to make appointment as soon as possible further evaluation and treatment.  3. Screening for colon cancer Order for Cologuard placed today and sent to [facility] 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         Ronnell Freshwater, NP  [place] [facility] [phone] (phone) [phone] (  fax)  [place]

## 2021-12-01 ENCOUNTER — Ambulatory Visit: Payer: Managed Care, Other (non HMO) | Admitting: "Endocrinology

## 2021-12-01 LAB — COLOGUARD: COLOGUARD: NEGATIVE

## 2021-12-04 NOTE — Progress Notes (Signed)
Please let the patient know that his cologuard test is negative. We should repeat this in three years. Thanks  -HB

## 2022-01-02 ENCOUNTER — Other Ambulatory Visit: Payer: Commercial Managed Care - HMO

## 2022-02-01 ENCOUNTER — Telehealth: Payer: Self-pay | Admitting: Nurse Practitioner

## 2022-02-01 NOTE — Telephone Encounter (Signed)
Patient is requesting new referral for a general surgeon not where the other order was placed. Please advise.

## 2022-02-02 ENCOUNTER — Other Ambulatory Visit: Payer: Self-pay | Admitting: Nurse Practitioner

## 2022-02-02 DIAGNOSIS — K4091 Unilateral inguinal hernia, without obstruction or gangrene, recurrent: Secondary | ICD-10-CM

## 2022-02-02 NOTE — Telephone Encounter (Signed)
Please let the patient know that I did a new referral to [facility] for further evaluation. Thanks so much.   -HB

## 2022-02-02 NOTE — Telephone Encounter (Signed)
Called pt LVM stating another referral was placed any question he can contact the office

## 2022-02-16 ENCOUNTER — Ambulatory Visit: Payer: Self-pay | Admitting: Surgery

## 2022-02-16 ENCOUNTER — Encounter: Payer: Self-pay | Admitting: Surgery

## 2022-02-16 ENCOUNTER — Other Ambulatory Visit: Payer: Self-pay

## 2022-02-16 ENCOUNTER — Ambulatory Visit: Payer: Commercial Managed Care - HMO | Admitting: Surgery

## 2022-02-16 VITALS — BP 115/76 | HR 89 | Temp 98.5°F | Ht 74.0 in | Wt 183.6 lb

## 2022-02-16 DIAGNOSIS — K4090 Unilateral inguinal hernia, without obstruction or gangrene, not specified as recurrent: Secondary | ICD-10-CM | POA: Insufficient documentation

## 2022-02-16 NOTE — Progress Notes (Signed)
Patient ID: Matthew Flynn, male   DOB: [birth year], 46 y.o.   MRN: [id number]  Chief Complaint: Left inguinal hernia  History of Present Illness Matthew Flynn is a 46 y.o. male with a long history of left inguinal hernia present.  He is well acquainted with maneuvers to reduce it.  Typically provoked with lifting coughing sneezing, otherwise quite well-tolerated.  Denies any voiding or GI issues.  No prior groin hernia surgery.  Past Medical History History reviewed. No pertinent past medical history.    History reviewed. No pertinent surgical history.  Allergies  Allergen Reactions   Bee Venom Other (See Comments)   Honey Bee Venom     Current Outpatient Medications  Medication Sig Dispense Refill   levothyroxine (SYNTHROID) 25 MCG tablet Take 1 tablet (25 mcg total) by mouth daily. 90 tablet 3   No current facility-administered medications for this visit.    Family History Family History  Problem Relation Age of Onset   Diabetes Mother    Cancer Father    Stroke Maternal Grandmother    Stroke Maternal Grandfather       Social History Social History   Tobacco Use   Smoking status: Every Day    Packs/day: 1.00    Types: Cigarettes  Vaping Use   Vaping Use: Never used  Substance Use Topics   Alcohol use: Yes   Drug use: Never        Review of Systems  Constitutional: Negative.   HENT: Negative.    Eyes: Negative.   Respiratory: Negative.    Cardiovascular: Negative.   Gastrointestinal: Negative.   Genitourinary: Negative.   Skin: Negative.   Neurological: Negative.   Psychiatric/Behavioral: Negative.        Physical Exam Blood pressure 115/76, pulse 89, temperature 98.5 F (36.9 C), temperature source Oral, height 6\' 2"  (1.88 m), weight 183 lb 9.6 oz (83.3 kg), SpO2 97 %. Last Weight  Most recent update: 02/16/2022  2:17 PM    Weight  83.3 kg (183 lb 9.6 oz)             CONSTITUTIONAL: Well developed, and nourished, appropriately  responsive and aware without distress.   EYES: Sclera non-icteric.   EARS, NOSE, MOUTH AND THROAT:  The oropharynx is clear. Oral mucosa is pink and moist.    Hearing is intact to voice.  NECK: Trachea is midline, and there is no jugular venous distension.  LYMPH NODES:  Lymph nodes in the neck are not enlarged. RESPIRATORY:   Normal respiratory effort without pathologic use of accessory muscles. CARDIOVASCULAR: Heart is regular in rate and rhythm. GI: The abdomen is  soft, nontender, and nondistended. There were no palpable masses. I did not appreciate hepatosplenomegaly. There were normal bowel sounds. GU: Large, reducible left inguinal hernia.  No right-sided hernia appreciated.  Testes are descended bilaterally. MUSCULOSKELETAL:  Symmetrical muscle tone appreciated in all four extremities.    SKIN: Skin turgor is normal. No pathologic skin lesions appreciated.  NEUROLOGIC:  Motor and sensation appear grossly normal.  Cranial nerves are grossly without defect. PSYCH:  Alert and oriented to person, place and time. Affect is appropriate for situation.  Data Reviewed I have personally reviewed what is currently available of the patient's imaging, recent labs and medical records.   Labs:     Latest Ref Rng & Units 07/12/2021    9:11 AM 02/08/2015    5:57 PM  CBC  WBC 3.4 - 10.8 x10E3/uL 9.3    Hemoglobin  13.0 - 17.7 g/dL 23.5  36.1   Hematocrit 37.5 - 51.0 % 44.5  45.0   Platelets 150 - 450 x10E3/uL 245        Latest Ref Rng & Units 07/12/2021    9:11 AM 02/08/2015    5:57 PM  CMP  Glucose 70 - 99 mg/dL 96  443   BUN 6 - 24 mg/dL 11  11   Creatinine 1.54 - 1.27 mg/dL 0.08  6.76   Sodium 195 - 144 mmol/L 139  138   Potassium 3.5 - 5.2 mmol/L 4.2  4.1   Chloride 96 - 106 mmol/L 101  101   CO2 20 - 29 mmol/L 24    Calcium 8.7 - 10.2 mg/dL 9.3    Total Protein 6.0 - 8.5 g/dL 6.6    Total Bilirubin 0.0 - 1.2 mg/dL 0.2    Alkaline Phos 44 - 121 IU/L 70    AST 0 - 40 IU/L 14    ALT 0  - 44 IU/L 16       Imaging:  Within last 24 hrs: No results found.  Assessment     Patient Active Problem List   Diagnosis Date Noted   Left inguinal hernia 02/16/2022   Hyperprolactinemia ([facility]) 09/28/2021   Elevated prolactin level 08/02/2021   Hypothyroidism 08/02/2021   Unilateral recurrent inguinal hernia without obstruction or gangrene 07/12/2021   TMJ (temporomandibular joint disorder) 07/12/2021   Low testosterone in male 07/12/2021   Pain in left knee 11/28/2018   Pain in right knee 11/28/2018    Plan    Robotic left inguinal hernia repair.  I discussed possibility of incarceration, strangulation, enlargement in size over time, and the need for emergency surgery in the face of these.  Also reviewed the techniques of reduction should incarceration occur, and when unsuccessful to present to the ED.  Also discussed that surgery risks include recurrence which can be up to 30% in the case of complex hernias, use of prosthetic materials (mesh) and the increased risk of infection and the possible need for re-operation and removal of mesh, possibility of post-op SBO or ileus, and the risks of general anesthetic including heart attack, stroke, sudden death or some reaction to anesthetic medications. The patient, and those present, appear to understand the risks, any and all questions were answered to the patient's satisfaction.  No guarantees were ever expressed or implied.   Face-to-face time spent with the patient and accompanying care providers(if present) was 30 minutes, with more than 50% of the time spent counseling, educating, and coordinating care of the patient.    These notes generated with voice recognition software. I apologize for typographical errors.  Campbell Lerner M.D., FACS 02/16/2022, 2:58 PM

## 2022-02-16 NOTE — Patient Instructions (Signed)
Our surgery scheduler will call you within 24-48 hours to schedule surgery. Please have the Blue surgery sheet available when speaking with her.   Inguinal Hernia, Adult An inguinal hernia develops when fat or the intestines push through a weak spot in a muscle where the leg meets the lower abdomen (groin). This creates a bulge. This kind of hernia could also be: In the scrotum, if you are male. In folds of skin around the vagina, if you are male. There are three types of inguinal hernias: Hernias that can be pushed back into the abdomen (are reducible). This type rarely causes pain. Hernias that are not reducible (are incarcerated). Hernias that are not reducible and lose their blood supply (are strangulated). This type of hernia requires emergency surgery. What are the causes? This condition is caused by having a weak spot in the muscles or tissues in your groin. This develops over time. The hernia may poke through the weak spot when you suddenly strain your lower abdominal muscles, such as when you: Lift a heavy object. Strain to have a bowel movement. Constipation can lead to straining. Cough. What increases the risk? This condition is more likely to develop in: Males. Pregnant females. People who: Are overweight. Work in jobs that require long periods of standing or heavy lifting. Have had an inguinal hernia before. Smoke or have lung disease. These factors can lead to long-term (chronic) coughing. What are the signs or symptoms? Symptoms may depend on the size of the hernia. Often, a small inguinal hernia has no symptoms. Symptoms of a larger hernia may include: A bulge in the groin area. This is easier to see when standing. It might not be visible when lying down. Pain or burning in the groin. This may get worse when lifting, straining, or coughing. A dull ache or a feeling of pressure in the groin. An unusual bulge in the scrotum, in males. Symptoms of a strangulated  inguinal hernia may include: A bulge in your groin that is very painful and tender to the touch. A bulge that turns red or purple. Fever, nausea, and vomiting. Inability to have a bowel movement or to pass gas. How is this diagnosed? This condition is diagnosed based on your symptoms, your medical history, and a physical exam. Your health care provider may feel your groin area and ask you to cough. How is this treated? Treatment depends on the size of your hernia and whether you have symptoms. If you do not have symptoms, your health care provider may have you watch your hernia carefully and have you come in for follow-up visits. If your hernia is large or if you have symptoms, you may need surgery to repair the hernia. Follow these instructions at home: Lifestyle Avoid lifting heavy objects. Avoid standing for long periods of time. Do not use any products that contain nicotine or tobacco. These products include cigarettes, chewing tobacco, and vaping devices, such as e-cigarettes. If you need help quitting, ask your health care provider. Maintain a healthy weight. Preventing constipation You may need to take these actions to prevent or treat constipation: Drink enough fluid to keep your urine pale yellow. Take over-the-counter or prescription medicines. Eat foods that are high in fiber, such as beans, whole grains, and fresh fruits and vegetables. Limit foods that are high in fat and processed sugars, such as fried or sweet foods. General instructions You may try to push the hernia back in place by very gently pressing on it while lying down. Do   not try to force the bulge back in if it will not push in easily. Watch your hernia for any changes in shape, size, or color. Get help right away if you notice any changes. Take over-the-counter and prescription medicines only as told by your health care provider. Keep all follow-up visits. This is important. Contact a health care provider if: You  have a fever or chills. You develop new symptoms. Your symptoms get worse. Get help right away if: You have pain in your groin that suddenly gets worse. You have a bulge in your groin that: Suddenly gets bigger and does not get smaller. Becomes red or purple or painful to the touch. You are a man and you have a sudden pain in your scrotum, or the size of your scrotum suddenly changes. You cannot push the hernia back in place by very gently pressing on it when you are lying down. You have nausea or vomiting that does not go away. You have a fast heartbeat. You cannot have a bowel movement or pass gas. These symptoms may represent a serious problem that is an emergency. Do not wait to see if the symptoms will go away. Get medical help right away. Call your local emergency services (911 in the [place] Summary An inguinal hernia develops when fat or the intestines push through a weak spot in a muscle where your leg meets your lower abdomen (groin). This condition is caused by having a weak spot in muscles or tissues in your groin. Symptoms may depend on the size of the hernia, and they may include pain or swelling in your groin. A small inguinal hernia often has no symptoms. Treatment may not be needed if you do not have symptoms. If you have symptoms or a large hernia, you may need surgery to repair the hernia. Avoid lifting heavy objects. Also, avoid standing for long periods of time. This information is not intended to replace advice given to you by your health care provider. Make sure you discuss any questions you have with your health care provider. Document Revised: 02/10/2020 Document Reviewed: 02/10/2020 Elsevier Patient Education  2023 [employer]  

## 2022-02-17 ENCOUNTER — Telehealth: Payer: Self-pay | Admitting: Surgery

## 2022-02-17 NOTE — Telephone Encounter (Signed)
Outgoing call, left message for patient to call.  Please inform him of the following regarding scheduled surgery.   Pre-Admission date/time, and Surgery date.  Surgery Date: 04/21/22 Preadmission Testing Date: 04/11/22 (phone 1p-5p)  Also patient will need to call at [phone], between 1-3:00pm the day before surgery, to find out what time to arrive for surgery.

## 2022-02-21 NOTE — Telephone Encounter (Signed)
Left message again for patient to call.   Also mailed surgery instructions to the patient with dates.

## 2022-04-11 ENCOUNTER — Encounter
Admission: RE | Admit: 2022-04-11 | Discharge: 2022-04-11 | Disposition: A | Discharge status: Home / Self Care | Payer: Commercial Managed Care - HMO | Source: Ambulatory Visit | Attending: Surgery | Admitting: Surgery

## 2022-04-11 HISTORY — DX: Hypothyroidism, unspecified: E03.9

## 2022-04-11 NOTE — Patient Instructions (Addendum)
Your procedure is scheduled on: Friday, October 27 Report to the Registration Desk on the 1st floor of the [facility]. To find out your arrival time, please call [phone] between 1PM - 3PM on: Thursday, October 26 If your arrival time is 6:00 am, do not arrive prior to that time as the [facility] entrance doors do not open until 6:00 am.  REMEMBER: Instructions that are not followed completely may result in serious medical risk, up to and including death; or upon the discretion of your surgeon and anesthesiologist your surgery may need to be rescheduled.  Do not eat food after midnight the night before surgery.  No gum chewing, lozengers or hard candies.  TAKE THESE MEDICATIONS THE MORNING OF SURGERY WITH A SIP OF WATER:  Levothyroxine  One week prior to surgery: starting October 20 Stop Anti-inflammatories (NSAIDS) such as Advil, Aleve, Ibuprofen, Motrin, Naproxen, Naprosyn and Aspirin based products such as Excedrin, Goodys Powder, BC Powder. Stop ANY OVER THE COUNTER supplements until after surgery. You may however, continue to take Tylenol if needed for pain up until the day of surgery.  No Alcohol for 24 hours before or after surgery.  No Smoking including e-cigarettes for 24 hours prior to surgery.  No chewable tobacco products for at least 6 hours prior to surgery.  No nicotine patches on the day of surgery.  Do not use any "recreational" drugs for at least a week prior to your surgery.  Please be advised that the combination of cocaine and anesthesia may have negative outcomes, up to and including death. If you test positive for cocaine, your surgery will be cancelled.  On the morning of surgery brush your teeth with toothpaste and water, you may rinse your mouth with mouthwash if you wish. Do not swallow any toothpaste or mouthwash.  Use CHG Soap as directed on instruction sheet.  Do not wear jewelry, make-up, hairpins, clips or nail polish.  Do not wear  lotions, powders, or perfumes.   Do not shave body from the neck down 48 hours prior to surgery just in case you cut yourself which could leave a site for infection.  Also, freshly shaved skin may become irritated if using the CHG soap.  Contact lenses, hearing aids and dentures may not be worn into surgery.  Do not bring valuables to the hospital. [facility] is not responsible for any missing/lost belongings or valuables.   Notify your doctor if there is any change in your medical condition (cold, fever, infection).  Wear comfortable clothing (specific to your surgery type) to the hospital.  After surgery, you can help prevent lung complications by doing breathing exercises.  Take deep breaths and cough every 1-2 hours. Your doctor may order a device called an Incentive Spirometer to help you take deep breaths. When coughing or sneezing, hold a pillow firmly against your incision with both hands. This is called "splinting." Doing this helps protect your incision. It also decreases belly discomfort.  If you are being discharged the day of surgery, you will not be allowed to drive home. You will need a responsible adult (18 years or older) to drive you home and stay with you that night.   If you are taking public transportation, you will need to have a responsible adult (18 years or older) with you. Please confirm with your physician that it is acceptable to use public transportation.   Please call the [facility] at [phone] if you have any questions about these  instructions.  Surgery Visitation Policy:  Patients undergoing a surgery or procedure may have two family members or support persons with them as long as the person is not COVID-19 positive or experiencing its symptoms.      Preparing for Surgery with CHLORHEXIDINE GLUCONATE (CHG) Soap  Chlorhexidine Gluconate (CHG) Soap  o An antiseptic cleaner that kills germs and bonds with the skin to continue  killing germs even after washing  o Used for showering the night before surgery and morning of surgery  Before surgery, you can play an important role by reducing the number of germs on your skin.  CHG (Chlorhexidine gluconate) soap is an antiseptic cleanser which kills germs and bonds with the skin to continue killing germs even after washing.  Please do not use if you have an allergy to CHG or antibacterial soaps. If your skin becomes reddened/irritated stop using the CHG.  1. Shower the NIGHT BEFORE SURGERY and the MORNING OF SURGERY with CHG soap.  2. If you choose to wash your hair, wash your hair first as usual with your normal shampoo.  3. After shampooing, rinse your hair and body thoroughly to remove the shampoo.  4. Use CHG as you would any other liquid soap. You can apply CHG directly to the skin and wash gently with a scrungie or a clean washcloth.  5. Apply the CHG soap to your body only from the neck down. Do not use on open wounds or open sores. Avoid contact with your eyes, ears, mouth, and genitals (private parts). Wash face and genitals (private parts) with your normal soap.  6. Wash thoroughly, paying special attention to the area where your surgery will be performed.  7. Thoroughly rinse your body with warm water.  8. Do not shower/wash with your normal soap after using and rinsing off the CHG soap.  9. Pat yourself dry with a clean towel.  10. Wear clean pajamas to bed the night before surgery.  12. Place clean sheets on your bed the night of your first shower and do not sleep with pets.  13. Shower again with the CHG soap on the day of surgery prior to arriving at the hospital.  14. Do not apply any deodorants/lotions/powders.  15. Please wear clean clothes to the hospital.

## 2022-04-21 ENCOUNTER — Encounter
Admission: RE | Disposition: A | Discharge status: Home / Self Care | Payer: Self-pay | Source: Home / Self Care | Attending: Surgery

## 2022-04-21 ENCOUNTER — Ambulatory Visit
Admission: RE | Admit: 2022-04-21 | Discharge: 2022-04-21 | Disposition: A | Discharge status: Home / Self Care | Payer: Commercial Managed Care - HMO | Attending: Surgery | Admitting: Surgery

## 2022-04-21 ENCOUNTER — Other Ambulatory Visit: Payer: Self-pay

## 2022-04-21 ENCOUNTER — Ambulatory Visit: Payer: Commercial Managed Care - HMO | Admitting: Certified Registered"

## 2022-04-21 ENCOUNTER — Encounter: Payer: Self-pay | Admitting: Surgery

## 2022-04-21 DIAGNOSIS — K4090 Unilateral inguinal hernia, without obstruction or gangrene, not specified as recurrent: Secondary | ICD-10-CM | POA: Diagnosis not present

## 2022-04-21 DIAGNOSIS — E039 Hypothyroidism, unspecified: Secondary | ICD-10-CM | POA: Diagnosis not present

## 2022-04-21 DIAGNOSIS — F17210 Nicotine dependence, cigarettes, uncomplicated: Secondary | ICD-10-CM | POA: Insufficient documentation

## 2022-04-21 SURGERY — HERNIORRHAPHY, INGUINAL, ROBOT-ASSISTED, LAPAROSCOPIC
Anesthesia: General | Site: Abdomen | Laterality: Left

## 2022-04-21 MED ORDER — 0.9 % SODIUM CHLORIDE (POUR BTL) OPTIME
TOPICAL | Status: DC | PRN
  Administered 2022-04-21: 500 mL

## 2022-04-21 MED ORDER — FAMOTIDINE 20 MG PO TABS: 20.0000 mg | ORAL_TABLET | Freq: Once | ORAL | Status: AC

## 2022-04-21 MED ORDER — BUPIVACAINE-EPINEPHRINE (PF) 0.25% -1:200000 IJ SOLN
INTRAMUSCULAR | Status: DC | PRN
  Administered 2022-04-21: 30 mL via PERINEURAL

## 2022-04-21 MED ORDER — CEFAZOLIN SODIUM-DEXTROSE 2-4 GM/100ML-% IV SOLN
2.0000 g | INTRAVENOUS | Status: AC
  Administered 2022-04-21: 2 g via INTRAVENOUS

## 2022-04-21 MED ORDER — FENTANYL CITRATE (PF) 100 MCG/2ML IJ SOLN
INTRAMUSCULAR | Status: AC
  Filled 2022-04-21: qty 2

## 2022-04-21 MED ORDER — ACETAMINOPHEN 500 MG PO TABS
ORAL_TABLET | ORAL | Status: AC
  Administered 2022-04-21: 1000 mg via ORAL
  Filled 2022-04-21: qty 2

## 2022-04-21 MED ORDER — DEXAMETHASONE SODIUM PHOSPHATE 10 MG/ML IJ SOLN
INTRAMUSCULAR | Status: AC
  Filled 2022-04-21: qty 1

## 2022-04-21 MED ORDER — CELECOXIB 200 MG PO CAPS
ORAL_CAPSULE | ORAL | Status: AC
  Administered 2022-04-21: 200 mg via ORAL
  Filled 2022-04-21: qty 1

## 2022-04-21 MED ORDER — ACETAMINOPHEN 500 MG PO TABS: 1000.0000 mg | ORAL_TABLET | ORAL | Status: AC

## 2022-04-21 MED ORDER — MIDAZOLAM HCL 2 MG/2ML IJ SOLN
INTRAMUSCULAR | Status: DC | PRN
  Administered 2022-04-21: 2 mg via INTRAVENOUS

## 2022-04-21 MED ORDER — CHLORHEXIDINE GLUCONATE 0.12 % MT SOLN: 15.0000 mL | Freq: Once | OROMUCOSAL | Status: AC

## 2022-04-21 MED ORDER — ORAL CARE MOUTH RINSE: 15.0000 mL | Freq: Once | OROMUCOSAL | Status: AC

## 2022-04-21 MED ORDER — LIDOCAINE HCL (CARDIAC) PF 100 MG/5ML IV SOSY
PREFILLED_SYRINGE | INTRAVENOUS | Status: DC | PRN
  Administered 2022-04-21: 100 mg via INTRAVENOUS

## 2022-04-21 MED ORDER — FENTANYL CITRATE (PF) 100 MCG/2ML IJ SOLN: 25.0000 ug | INTRAMUSCULAR | Status: DC | PRN

## 2022-04-21 MED ORDER — CEFAZOLIN SODIUM-DEXTROSE 2-4 GM/100ML-% IV SOLN
INTRAVENOUS | Status: AC
  Filled 2022-04-21: qty 100

## 2022-04-21 MED ORDER — BUPIVACAINE LIPOSOME 1.3 % IJ SUSP: 20.0000 mL | Freq: Once | INTRAMUSCULAR | Status: DC

## 2022-04-21 MED ORDER — SODIUM CHLORIDE (PF) 0.9 % IJ SOLN
INTRAMUSCULAR | Status: AC
  Filled 2022-04-21: qty 10

## 2022-04-21 MED ORDER — FAMOTIDINE 20 MG PO TABS
ORAL_TABLET | ORAL | Status: AC
  Administered 2022-04-21: 20 mg via ORAL
  Filled 2022-04-21: qty 1

## 2022-04-21 MED ORDER — ONDANSETRON HCL 4 MG/2ML IJ SOLN
INTRAMUSCULAR | Status: DC | PRN
  Administered 2022-04-21: 4 mg via INTRAVENOUS

## 2022-04-21 MED ORDER — ROCURONIUM BROMIDE 10 MG/ML (PF) SYRINGE
PREFILLED_SYRINGE | INTRAVENOUS | Status: AC
  Filled 2022-04-21: qty 10

## 2022-04-21 MED ORDER — FENTANYL CITRATE (PF) 100 MCG/2ML IJ SOLN
INTRAMUSCULAR | Status: DC | PRN
  Administered 2022-04-21 (×2): 50 ug via INTRAVENOUS

## 2022-04-21 MED ORDER — PROPOFOL 10 MG/ML IV BOLUS
INTRAVENOUS | Status: DC | PRN
  Administered 2022-04-21: 200 mg via INTRAVENOUS

## 2022-04-21 MED ORDER — SUGAMMADEX SODIUM 200 MG/2ML IV SOLN
INTRAVENOUS | Status: DC | PRN
  Administered 2022-04-21: 200 mg via INTRAVENOUS

## 2022-04-21 MED ORDER — PROPOFOL 10 MG/ML IV BOLUS
INTRAVENOUS | Status: AC
  Filled 2022-04-21: qty 20

## 2022-04-21 MED ORDER — LIDOCAINE HCL (PF) 2 % IJ SOLN
INTRAMUSCULAR | Status: AC
  Filled 2022-04-21: qty 5

## 2022-04-21 MED ORDER — BUPIVACAINE LIPOSOME 1.3 % IJ SUSP
INTRAMUSCULAR | Status: AC
  Filled 2022-04-21: qty 20

## 2022-04-21 MED ORDER — ONDANSETRON HCL 4 MG/2ML IJ SOLN: 4.0000 mg | Freq: Once | INTRAMUSCULAR | Status: DC | PRN

## 2022-04-21 MED ORDER — LACTATED RINGERS IV SOLN: INTRAVENOUS | Status: DC

## 2022-04-21 MED ORDER — GABAPENTIN 300 MG PO CAPS
ORAL_CAPSULE | ORAL | Status: AC
  Administered 2022-04-21: 300 mg via ORAL
  Filled 2022-04-21: qty 1

## 2022-04-21 MED ORDER — CHLORHEXIDINE GLUCONATE 0.12 % MT SOLN
OROMUCOSAL | Status: AC
  Administered 2022-04-21: 15 mL via OROMUCOSAL
  Filled 2022-04-21: qty 15

## 2022-04-21 MED ORDER — GABAPENTIN 300 MG PO CAPS: 300.0000 mg | ORAL_CAPSULE | ORAL | Status: AC

## 2022-04-21 MED ORDER — CELECOXIB 200 MG PO CAPS: 200.0000 mg | ORAL_CAPSULE | ORAL | Status: AC

## 2022-04-21 MED ORDER — HYDROCODONE-ACETAMINOPHEN 5-325 MG PO TABS: 1.0000 | ORAL_TABLET | Freq: Four times a day (QID) | ORAL | 0 refills | Status: DC | PRN

## 2022-04-21 MED ORDER — CHLORHEXIDINE GLUCONATE CLOTH 2 % EX PADS: 6.0000 | MEDICATED_PAD | Freq: Once | CUTANEOUS | Status: DC

## 2022-04-21 MED ORDER — ROCURONIUM BROMIDE 100 MG/10ML IV SOLN
INTRAVENOUS | Status: DC | PRN
  Administered 2022-04-21: 60 mg via INTRAVENOUS

## 2022-04-21 MED ORDER — MIDAZOLAM HCL 2 MG/2ML IJ SOLN
INTRAMUSCULAR | Status: AC
  Filled 2022-04-21: qty 2

## 2022-04-21 MED ORDER — BUPIVACAINE-EPINEPHRINE (PF) 0.25% -1:200000 IJ SOLN
INTRAMUSCULAR | Status: AC
  Filled 2022-04-21: qty 30

## 2022-04-21 MED ORDER — DEXAMETHASONE SODIUM PHOSPHATE 10 MG/ML IJ SOLN
INTRAMUSCULAR | Status: DC | PRN
  Administered 2022-04-21: 10 mg via INTRAVENOUS

## 2022-04-21 SURGICAL SUPPLY — 46 items
ADH SKN CLS APL DERMABOND .7 (GAUZE/BANDAGES/DRESSINGS) ×1
BLADE CLIPPER SURG (BLADE) ×1 IMPLANT
COVER TIP SHEARS 8 DVNC (MISCELLANEOUS) ×1 IMPLANT
COVER TIP SHEARS 8MM DA VINCI (MISCELLANEOUS) ×1
COVER WAND RF STERILE (DRAPES) ×1 IMPLANT
DERMABOND ADVANCED .7 DNX12 (GAUZE/BANDAGES/DRESSINGS) ×1 IMPLANT
DRAPE ARM DVNC X/XI (DISPOSABLE) ×3 IMPLANT
DRAPE COLUMN DVNC XI (DISPOSABLE) ×1 IMPLANT
DRAPE DA VINCI XI ARM (DISPOSABLE) ×3
DRAPE DA VINCI XI COLUMN (DISPOSABLE) ×1
ELECT REM PT RETURN 9FT ADLT (ELECTROSURGICAL) ×1
ELECTRODE REM PT RTRN 9FT ADLT (ELECTROSURGICAL) ×1 IMPLANT
GLOVE ORTHO TXT STRL SZ7.5 (GLOVE) ×3 IMPLANT
GOWN STRL REUS W/ TWL LRG LVL3 (GOWN DISPOSABLE) ×1 IMPLANT
GOWN STRL REUS W/ TWL XL LVL3 (GOWN DISPOSABLE) ×2 IMPLANT
GOWN STRL REUS W/TWL LRG LVL3 (GOWN DISPOSABLE) ×1
GOWN STRL REUS W/TWL XL LVL3 (GOWN DISPOSABLE) ×2
GRASPER SUT TROCAR 14GX15 (MISCELLANEOUS) IMPLANT
IRRIGATION STRYKERFLOW (MISCELLANEOUS) IMPLANT
IRRIGATOR STRYKERFLOW (MISCELLANEOUS)
IV NS 1000ML (IV SOLUTION)
IV NS 1000ML BAXH (IV SOLUTION) IMPLANT
KIT PINK PAD W/HEAD ARE REST (MISCELLANEOUS) ×1
KIT PINK PAD W/HEAD ARM REST (MISCELLANEOUS) ×1 IMPLANT
LABEL OR SOLS (LABEL) ×1 IMPLANT
MANIFOLD NEPTUNE II (INSTRUMENTS) ×1 IMPLANT
MESH 3DMAX LIGHT 4.1X6.2 LT LR (Mesh General) IMPLANT
NDL INSUFFLATION 14GA 120MM (NEEDLE) IMPLANT
NEEDLE HYPO 22GX1.5 SAFETY (NEEDLE) ×1 IMPLANT
NEEDLE INSUFFLATION 14GA 120MM (NEEDLE) ×1 IMPLANT
PACK LAP CHOLECYSTECTOMY (MISCELLANEOUS) ×1 IMPLANT
SEAL CANN UNIV 5-8 DVNC XI (MISCELLANEOUS) ×3 IMPLANT
SEAL XI 5MM-8MM UNIVERSAL (MISCELLANEOUS) ×3
SET TUBE SMOKE EVAC HIGH FLOW (TUBING) ×1 IMPLANT
SOLUTION ELECTROLUBE (MISCELLANEOUS) ×1 IMPLANT
SUT MNCRL 4-0 (SUTURE) ×1
SUT MNCRL 4-0 27XMFL (SUTURE) ×1
SUT V-LOC 90 ABS 3-0 VLT  V-20 (SUTURE)
SUT V-LOC 90 ABS 3-0 VLT V-20 (SUTURE) IMPLANT
SUT VIC AB 0 CT2 27 (SUTURE) ×1 IMPLANT
SUT VIC AB 2-0 RB1 27 (SUTURE) ×1 IMPLANT
SUT VLOC 90 2/L VL 12 GS22 (SUTURE) IMPLANT
SUT VLOC 90 S/L VL9 GS22 (SUTURE) IMPLANT
SUTURE MNCRL 4-0 27XMF (SUTURE) ×1 IMPLANT
TRAP FLUID SMOKE EVACUATOR (MISCELLANEOUS) ×1 IMPLANT
WATER STERILE IRR 500ML POUR (IV SOLUTION) ×1 IMPLANT

## 2022-04-21 NOTE — Discharge Instructions (Signed)

## 2022-04-21 NOTE — Op Note (Signed)
Robotic assisted Laparoscopic Transabdominal Left Inguinal Hernia Repair with Mesh       Pre-operative Diagnosis:  Left  Inguinal Hernia   Post-operative Diagnosis: Same   Procedure: Robotic assisted Laparoscopic  repair of left inguinal hernia(s)   Surgeon: Ronny Bacon, M.D., FACS   Anesthesia: GETA   Findings: Left  inguinal hernia, no  evidence of right sided hernia.         Procedure Details  The patient was seen again in the Holding Room. The benefits, complications, treatment options, and expected outcomes were discussed with the patient. The risks of bleeding, infection, recurrence of symptoms, failure to resolve symptoms, recurrence of hernia, ischemic orchitis, chronic pain syndrome or neuroma, were reviewed again. The likelihood of improving the patient's symptoms with return to their baseline status is good.  The patient and/or family concurred with the proposed plan, giving informed consent.  The patient was taken to Operating Room, identified  and the procedure verified as Laparoscopic Inguinal Hernia Repair. Laterality confirmed.  A Time Out was held and the above information confirmed.   Prior to the induction of general anesthesia, antibiotic prophylaxis was administered. VTE prophylaxis was in place. General endotracheal anesthesia was then administered and tolerated well. After the induction, the abdomen was prepped with Chloraprep and draped in the sterile fashion. The patient was positioned in the supine position.   After local infiltration of quarter percent Marcaine with epinephrine, stab incision was made left upper quadrant.  On the left at Palmer's point, the Veress needle is passed with sensation of the layers to penetrate the abdominal wall and into the peritoneum.  Saline drop test is confirmed peritoneal placement.  Insufflation is initiated with carbon dioxide to pressures of 15 mmHg. An 8.5 mm port is placed to the left off of the midline, with blunt tipped  trocar.  Pneumoperitoneum maintained w/o HD changes using the AirSeal to pressures of 15 mm Hg with CO2. No evidence of bowel injuries.  Two 8.5 mm ports placed under direct vision in each upper quadrant. The laparoscopy revealed a left indirect defect(s).   The robot was brought ot the table and docked in the standard fashion, no collision between arms was observed. Instruments were kept under direct view at all times. For left inguinal hernia repair,  I developed a peritoneal flap. The sac(s) were reduced and dissected free from adjacent structures. We preserved the vas and the vessels, and visualized them to their convergence and beyond in the retroperitoneum. Once dissection was completed a large left sided BARD 3D Light mesh was placed and secured at three points with interrupted 0 Vicryl to the pubic tubercle and anteriorly. There was good coverage of the direct, indirect and femoral spaces.  Second look revealed no complications or injuries.  The flap was then closed with 2-0 V-lock suture.  Peritoneal closure without defects.  Once assuring that hemostasis was adequate, all needles/sponges removed, and the robot was undocked.  Under direct visualization I placed the Veress needle into the preperitoneal space the Veress' valve was released allowing extraperitoneal CO2 to escape, it was also used to access the space for supplemental local anesthesia. The ports were removed, the abdomen desulflated.  4-0 subcuticular Monocryl was used at all skin edges. Dermabond was placed.  Patient tolerated the procedure well. There were no complications. He was taken to the recovery room in stable condition.           Ronny Bacon, M.D., FACS 04/21/2022, 9:35 AM

## 2022-04-21 NOTE — Anesthesia Procedure Notes (Signed)
Procedure Name: Intubation Date/Time: 04/21/2022 7:50 AM  Performed by: Cammie Sickle, CRNAPre-anesthesia Checklist: Patient identified, Patient being monitored, Timeout performed, Emergency Drugs available and Suction available Patient Re-evaluated:Patient Re-evaluated prior to induction Oxygen Delivery Method: Circle system utilized Preoxygenation: Pre-oxygenation with 100% oxygen Induction Type: IV induction Ventilation: Mask ventilation without difficulty Laryngoscope Size: 3 and McGraph Grade View: Grade I Tube type: Oral Tube size: 7.0 mm Number of attempts: 1 Airway Equipment and Method: Stylet Placement Confirmation: ETT inserted through vocal cords under direct vision, positive ETCO2 and breath sounds checked- equal and bilateral Secured at: 22 cm Tube secured with: Tape Dental Injury: Teeth and Oropharynx as per pre-operative assessment

## 2022-04-21 NOTE — Anesthesia Preprocedure Evaluation (Signed)
Anesthesia Evaluation  Patient identified by MRN, date of birth, ID band Patient awake    Reviewed: Allergy & Precautions, H&P , NPO status , Patient's Chart, lab work & pertinent test results, reviewed documented beta blocker date and time   Airway Mallampati: II  TM Distance: >3 FB Neck ROM: full    Dental  (+) Teeth Intact   Pulmonary neg pulmonary ROS, Current Smoker and Patient abstained from smoking.,    Pulmonary exam normal        Cardiovascular Exercise Tolerance: Good negative cardio ROS Normal cardiovascular exam Rhythm:regular Rate:Normal     Neuro/Psych negative neurological ROS  negative psych ROS   GI/Hepatic negative GI ROS, Neg liver ROS,   Endo/Other  Hypothyroidism   Renal/GU negative Renal ROS  negative genitourinary   Musculoskeletal   Abdominal   Peds  Hematology negative hematology ROS (+)   Anesthesia Other Findings Past Medical History: No date: Hypothyroidism 2023: Inguinal hernia, left Past Surgical History: No date: NO PAST SURGERIES BMI    Body Mass Index: 23.75 kg/m     Reproductive/Obstetrics negative OB ROS                             Anesthesia Physical Anesthesia Plan  ASA: 2  Anesthesia Plan: General ETT   Post-op Pain Management:    Induction:   PONV Risk Score and Plan:   Airway Management Planned:   Additional Equipment:   Intra-op Plan:   Post-operative Plan:   Informed Consent: I have reviewed the patients History and Physical, chart, labs and discussed the procedure including the risks, benefits and alternatives for the proposed anesthesia with the patient or authorized representative who has indicated his/her understanding and acceptance.     Dental Advisory Given  Plan Discussed with: CRNA  Anesthesia Plan Comments:         Anesthesia Quick Evaluation

## 2022-04-21 NOTE — Interval H&P Note (Signed)
History and Physical Interval Note:  04/21/2022 7:26 AM  Matthew Flynn  has presented today for surgery, with the diagnosis of left inguinal hernia K40.90.  The various methods of treatment have been discussed with the patient and family. After consideration of risks, benefits and other options for treatment, the patient has consented to  Procedure(s): XI ROBOTIC [place] (Left) as a surgical intervention.  The patient's history has been reviewed, patient examined, no change in status, stable for surgery.  I have reviewed the patient's chart and labs.  Questions were answered to the patient's satisfaction.   The left side is marked.   Ronny Bacon

## 2022-04-21 NOTE — Transfer of Care (Signed)
Immediate Anesthesia Transfer of Care Note  Patient: Matthew Flynn  Procedure(s) Performed: XI ROBOTIC ASSISTED INGUINAL HERNIA (Left: Abdomen)  Patient Location: PACU  Anesthesia Type:General  Level of Consciousness: drowsy  Airway & Oxygen Therapy: Patient Spontanous Breathing and Patient connected to face mask oxygen  Post-op Assessment: Report given to RN and Post -op Vital signs reviewed and stable  Post vital signs: Reviewed and stable  Last Vitals:  Vitals Value Taken Time  BP 144/88 04/21/22 0911  Temp 36.1 0911  Pulse 81 04/21/22 0914  Resp 18 04/21/22 0914  SpO2 100 % 04/21/22 0914  Vitals shown include unvalidated device data.  Last Pain:  Vitals:   04/21/22 0645  TempSrc: Temporal  PainSc: 0-No pain         Complications: No notable events documented.

## 2022-04-21 NOTE — H&P (Signed)
Signed      Patient ID: Matthew Flynn, male   DOB: 1976-06-25, 46 y.o.   MRN: [id number]   Chief Complaint: Left inguinal hernia   History of Present Illness Matthew Flynn is a 46 y.o. male with a long history of left inguinal hernia present.  He is well acquainted with maneuvers to reduce it.  Typically provoked with lifting coughing sneezing, otherwise quite well-tolerated.  Denies any voiding or GI issues.  No prior groin hernia surgery.   Past Medical History History reviewed. No pertinent past medical history.      History reviewed. No pertinent surgical history.       Allergies  Allergen Reactions   Bee Venom Other (See Comments)   Honey Bee Venom              Current Outpatient Medications  Medication Sig Dispense Refill   levothyroxine (SYNTHROID) 25 MCG tablet Take 1 tablet (25 mcg total) by mouth daily. 90 tablet 3    No current facility-administered medications for this visit.      Family History      Family History  Problem Relation Age of Onset   Diabetes Mother     Cancer Father     Stroke Maternal Grandmother     Stroke Maternal Grandfather          Social History Social History         Tobacco Use   Smoking status: Every Day      Packs/day: 1.00      Types: Cigarettes  Vaping Use   Vaping Use: Never used  Substance Use Topics   Alcohol use: Yes   Drug use: Never          Review of Systems  Constitutional: Negative.   HENT: Negative.    Eyes: Negative.   Respiratory: Negative.    Cardiovascular: Negative.   Gastrointestinal: Negative.   Genitourinary: Negative.   Skin: Negative.   Neurological: Negative.   Psychiatric/Behavioral: Negative.          Physical Exam Blood pressure 115/76, pulse 89, temperature 98.5 F (36.9 C), temperature source Oral, height 6\' 2"  (1.88 m), weight 183 lb 9.6 oz (83.3 kg), SpO2 97 %. Last Weight  Most recent update: 02/16/2022  2:17 PM      Weight  83.3 kg (183 lb 9.6 oz)                      CONSTITUTIONAL: Well developed, and nourished, appropriately responsive and aware without distress.   EYES: Sclera non-icteric.   EARS, NOSE, MOUTH AND THROAT:  The oropharynx is clear. Oral mucosa is pink and moist.    Hearing is intact to voice.  NECK: Trachea is midline, and there is no jugular venous distension.  LYMPH NODES:  Lymph nodes in the neck are not enlarged. RESPIRATORY:   Normal respiratory effort without pathologic use of accessory muscles. CARDIOVASCULAR: Heart is regular in rate and rhythm. GI: The abdomen is  soft, nontender, and nondistended. There were no palpable masses. I did not appreciate hepatosplenomegaly. There were normal bowel sounds. GU: Large, reducible left inguinal hernia.  No right-sided hernia appreciated.  Testes are descended bilaterally. MUSCULOSKELETAL:  Symmetrical muscle tone appreciated in all four extremities.    SKIN: Skin turgor is normal. No pathologic skin lesions appreciated.  NEUROLOGIC:  Motor and sensation appear grossly normal.  Cranial nerves are grossly without defect. PSYCH:  Alert and  oriented to person, place and time. Affect is appropriate for situation.   Data Reviewed I have personally reviewed what is currently available of the patient's imaging, recent labs and medical records.   Labs:      Latest Ref Rng & Units 07/12/2021    9:11 AM 02/08/2015    5:57 PM  CBC  WBC 3.4 - 10.8 x10E3/uL 9.3     Hemoglobin 13.0 - 17.7 g/dL 14.8  15.3   Hematocrit 37.5 - 51.0 % 44.5  45.0   Platelets 150 - 450 x10E3/uL 245           Latest Ref Rng & Units 07/12/2021    9:11 AM 02/08/2015    5:57 PM  CMP  Glucose 70 - 99 mg/dL 96  114   BUN 6 - 24 mg/dL 11  11   Creatinine 0.76 - 1.27 mg/dL 0.88  0.90   Sodium 134 - 144 mmol/L 139  138   Potassium 3.5 - 5.2 mmol/L 4.2  4.1   Chloride 96 - 106 mmol/L 101  101   CO2 20 - 29 mmol/L 24     Calcium 8.7 - 10.2 mg/dL 9.3     Total Protein 6.0 - 8.5 g/dL 6.6     Total Bilirubin 0.0  - 1.2 mg/dL 0.2     Alkaline Phos 44 - 121 IU/L 70     AST 0 - 40 IU/L 14     ALT 0 - 44 IU/L 16           Imaging:   Within last 24 hrs: No results found.   Assessment   Assessment     Patient Active Problem List    Diagnosis Date Noted   Left inguinal hernia 02/16/2022   Hyperprolactinemia ([place]) 09/28/2021   Elevated prolactin level 08/02/2021   Hypothyroidism 08/02/2021   Unilateral recurrent inguinal hernia without obstruction or gangrene 07/12/2021   TMJ (temporomandibular joint disorder) 07/12/2021   Low testosterone in male 07/12/2021   Pain in left knee 11/28/2018   Pain in right knee 11/28/2018      Plan     Plan Robotic left inguinal hernia repair.   I discussed possibility of incarceration, strangulation, enlargement in size over time, and the need for emergency surgery in the face of these.  Also reviewed the techniques of reduction should incarceration occur, and when unsuccessful to present to the ED.  Also discussed that surgery risks include recurrence which can be up to 30% in the case of complex hernias, use of prosthetic materials (mesh) and the increased risk of infection and the possible need for re-operation and removal of mesh, possibility of post-op SBO or ileus, and the risks of general anesthetic including heart attack, stroke, sudden death or some reaction to anesthetic medications. The patient, and those present, appear to understand the risks, any and all questions were answered to the patient's satisfaction.  No guarantees were ever expressed or implied.    These notes generated with voice recognition software. I apologize for typographical errors.   Ronny Bacon, M.D., [facility]  04/21/2022 ; 7:25 AM

## 2022-04-25 NOTE — Anesthesia Postprocedure Evaluation (Signed)
Anesthesia Post Note  Patient: Matthew Flynn  Procedure(s) Performed: XI ROBOTIC ASSISTED INGUINAL HERNIA WITH MESH (Left: Abdomen)  Patient location during evaluation: PACU Anesthesia Type: General Level of consciousness: awake and alert Pain management: pain level controlled Vital Signs Assessment: post-procedure vital signs reviewed and stable Respiratory status: spontaneous breathing, nonlabored ventilation, respiratory function stable and patient connected to nasal cannula oxygen Cardiovascular status: blood pressure returned to baseline and stable Postop Assessment: no apparent nausea or vomiting Anesthetic complications: no   No notable events documented.   Last Vitals:  Vitals:   04/21/22 0943 04/21/22 0953  BP: 113/80 110/85  Pulse: 60 (!) 54  Resp: 12 15  Temp: (!) 36.3 C (!) 36.3 C  SpO2: 99% 100%    Last Pain:  Vitals:   04/21/22 0953  TempSrc: Temporal  PainSc:                  Molli Barrows

## 2022-05-04 ENCOUNTER — Encounter: Payer: Self-pay | Admitting: Physician Assistant

## 2022-05-04 ENCOUNTER — Ambulatory Visit (INDEPENDENT_AMBULATORY_CARE_PROVIDER_SITE_OTHER): Payer: Commercial Managed Care - HMO | Admitting: Physician Assistant

## 2022-05-04 VITALS — BP 110/68 | HR 89 | Temp 98.9°F | Ht 74.0 in | Wt 175.0 lb

## 2022-05-04 DIAGNOSIS — K4090 Unilateral inguinal hernia, without obstruction or gangrene, not specified as recurrent: Secondary | ICD-10-CM

## 2022-05-04 DIAGNOSIS — Z09 Encounter for follow-up examination after completed treatment for conditions other than malignant neoplasm: Secondary | ICD-10-CM

## 2022-05-04 NOTE — Patient Instructions (Signed)

## 2022-05-04 NOTE — Progress Notes (Signed)
[facility] POST-OP OFFICE VISIT  05/04/2022  HPI: Matthew Flynn is a 46 y.o. male 13 days s/p robotic assisted laparoscopic left inguinal hernia repair with Dr Claudine Mouton   He is doing very well He reports almost no pain the entire time; never needed narcotics No abdominal pain, fever, chills, nausea, emesis, or bowel/urinary changes Incisions are well healed No other complaints   Vital signs: BP 110/68   Pulse 89   Temp 98.9 F (37.2 C)   Ht 6\' 2"  (1.88 m)   Wt 175 lb (79.4 kg)   SpO2 98%   BMI 22.47 kg/m    Physical Exam: Constitutional: Well appearing male, NAD Abdomen: Soft, non-tender, non-distended, no rebound/guarding Skin: Laparoscopic incisions are healing well, no erythema or drainage   Assessment/Plan: This is a 46 y.o. male 13 days s/p robotic assisted laparoscopic left inguinal hernia repair with Dr 54    - Pain control prn  - Reviewed wound care recommendation  - Reviewed lifting restrictions; 4-6 weeks total  - He can follow up on as needed basis; He understands to call with questions/concerns  -- Claudine Mouton, PA-C [facility] 05/04/2022, 2:43 PM M-F: 7am - 4pm

## 2022-05-10 ENCOUNTER — Encounter: Payer: Commercial Managed Care - HMO | Admitting: Nurse Practitioner

## 2022-05-12 ENCOUNTER — Encounter: Payer: Self-pay | Admitting: Nurse Practitioner

## 2022-05-12 ENCOUNTER — Ambulatory Visit (INDEPENDENT_AMBULATORY_CARE_PROVIDER_SITE_OTHER): Payer: Commercial Managed Care - HMO | Admitting: Nurse Practitioner

## 2022-05-12 VITALS — BP 111/74 | HR 71 | Ht 74.0 in | Wt 178.0 lb

## 2022-05-12 DIAGNOSIS — Z0001 Encounter for general adult medical examination with abnormal findings: Secondary | ICD-10-CM

## 2022-05-12 DIAGNOSIS — E039 Hypothyroidism, unspecified: Secondary | ICD-10-CM | POA: Diagnosis not present

## 2022-05-12 DIAGNOSIS — H9313 Tinnitus, bilateral: Secondary | ICD-10-CM | POA: Diagnosis not present

## 2022-05-12 NOTE — Progress Notes (Signed)
Complete physical exam   Patient: Matthew Flynn   DOB: [birth year]   46 y.o. Male  MRN: [id number] Visit Date: 05/12/2022    Chief Complaint  Patient presents with   Annual Exam   Subjective    Matthew Flynn is a 46 y.o. male who presents today for a complete physical exam.  He reports consuming a  generally healthy  diet. The patient has a physically strenuous job, but has no regular exercise apart from work.  He generally feels well. He does have additional problems to discuss today.   HPI  Annual physical.  -had repair of large left inguinal hernia.  -has healed well.  -does report some ringing in his hears. 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  -would like to have referral to audiology -left foot pain due to plantar fasciitis.   Past Medical History:  Diagnosis Date   Hypothyroidism    Inguinal hernia, left 2023   Past Surgical History:  Procedure Laterality Date   NO PAST SURGERIES     Social History   Socioeconomic History   Marital status: Single    Spouse name: Not on file   Number of children: 0   Years of education: Not on file   Highest education level: Not on file  Occupational History   Not on file  Tobacco Use   Smoking status: Every Day    Packs/day: 1.00    Types: Cigarettes   Smokeless tobacco: Never  Vaping Use   Vaping Use: Never used  Substance and Sexual Activity   Alcohol use: Yes    Comment: occassional   Drug use: Never   Sexual activity: Yes  Other Topics Concern   Not on file  Social History Narrative   Lives with girlfriend   Social Determinants of Health   Financial Resource Strain: Not on file  Food Insecurity: Not on file  Transportation Needs: Not on file  Physical Activity: Not on file  Stress: Not on file  Social Connections: Not on file  Intimate Partner Violence: Not on file   Family Status  Relation Name Status   Mother  (Not Specified)   Father  (Not Specified)   MGM  (Not Specified)   MGF   (Not Specified)   Family History  Problem Relation Age of Onset   Diabetes Mother    Cancer Father    Stroke Maternal Grandmother    Stroke Maternal Grandfather    Allergies  Allergen Reactions   Bee Venom Other (See Comments)   Honey Bee Venom     Patient Care Team: Ronnell Freshwater, NP as PCP - General (Family Medicine)   Medications: Outpatient Medications Prior to Visit  Medication Sig   levothyroxine (SYNTHROID) 25 MCG tablet Take 1 tablet (25 mcg total) by mouth daily.   No facility-administered medications prior to visit.    Review of Systems  Constitutional:  Negative for activity change, chills, fatigue and fever.  HENT:  Positive for tinnitus. Negative for congestion, postnasal drip, rhinorrhea, sinus pressure, sinus pain, sneezing and sore throat.   Eyes: Negative.   Respiratory:  Negative for cough, shortness of breath and wheezing.   Cardiovascular:  Negative for chest pain and palpitations.  Gastrointestinal:  Negative for constipation, diarrhea, nausea and vomiting.  Endocrine: Negative for cold intolerance, heat intolerance, polydipsia and polyuria.  Genitourinary:  Negative for dysuria, frequency and urgency.  Musculoskeletal:  Negative for back pain and myalgias.  Skin:  Negative  for rash.  Allergic/Immunologic: Negative for environmental allergies.  Neurological:  Negative for dizziness, weakness and headaches.  Psychiatric/Behavioral:  The patient is not nervous/anxious.     Last CBC Lab Results  Component Value Date   WBC 9.3 07/12/2021   HGB 14.8 07/12/2021   HCT 44.5 07/12/2021   MCV 92 07/12/2021   MCH 30.6 07/12/2021   RDW 12.9 07/12/2021   PLT 245 00/34/9179   Last metabolic panel Lab Results  Component Value Date   GLUCOSE 96 07/12/2021   NA 139 07/12/2021   K 4.2 07/12/2021   CL 101 07/12/2021   CO2 24 07/12/2021   BUN 11 07/12/2021   CREATININE 0.88 07/12/2021   EGFR 109 07/12/2021   CALCIUM 9.3 07/12/2021   PROT 6.6  07/12/2021   ALBUMIN 4.5 07/12/2021   LABGLOB 2.1 07/12/2021   AGRATIO 2.1 07/12/2021   BILITOT 0.2 07/12/2021   ALKPHOS 70 07/12/2021   AST 14 07/12/2021   ALT 16 07/12/2021   Last lipids Lab Results  Component Value Date   CHOL 162 07/12/2021   HDL 43 07/12/2021   LDLCALC 83 07/12/2021   TRIG 218 (H) 07/12/2021   CHOLHDL 3.8 07/12/2021   Last hemoglobin A1c Lab Results  Component Value Date   HGBA1C 5.6 07/12/2021   Last thyroid functions Lab Results  Component Value Date   TSH 2.870 11/01/2021   T3TOTAL 124 11/01/2021        Objective     Today's Vitals   05/12/22 0938  BP: 111/74  Pulse: 71  SpO2: 98%  Weight: 178 lb (80.7 kg)  Height: _0  (1.88 m)   Body mass index is 22.85 kg/m.  BP Readings from Last 3 Encounters:  05/12/22 111/74  05/04/22 110/68  04/21/22 110/85    Wt Readings from Last 3 Encounters:  05/12/22 178 lb (80.7 kg)  05/04/22 175 lb (79.4 kg)  04/21/22 185 lb (83.9 kg)     Physical Exam Vitals and nursing note reviewed.  Constitutional:      Appearance: Normal appearance. He is well-developed.  HENT:     Head: Normocephalic and atraumatic.     Right Ear: Tympanic membrane, ear canal and external ear normal.     Left Ear: Tympanic membrane, ear canal and external ear normal.     Nose: Nose normal.     Mouth/Throat:     Mouth: Mucous membranes are moist.     Pharynx: Oropharynx is clear.  Eyes:     Extraocular Movements: Extraocular movements intact.     Conjunctiva/sclera: Conjunctivae normal.     Pupils: Pupils are equal, round, and reactive to light.  Cardiovascular:     Rate and Rhythm: Normal rate and regular rhythm.     Pulses: Normal pulses.     Heart sounds: Normal heart sounds.  Pulmonary:     Effort: Pulmonary effort is normal.     Breath sounds: Normal breath sounds.  Abdominal:     General: Bowel sounds are normal. There is no distension.     Palpations: Abdomen is soft. There is no mass.      Tenderness: There is no abdominal tenderness. There is no right CVA tenderness, left CVA tenderness, guarding or rebound.     Hernia: No hernia is present.  Musculoskeletal:        General: Normal range of motion.     Cervical back: Normal range of motion and neck supple.  Lymphadenopathy:     Cervical: No cervical adenopathy.  Skin:    General: Skin is warm and dry.     Capillary Refill: Capillary refill takes less than 2 seconds.  Neurological:     General: No focal deficit present.     Mental Status: He is alert and oriented to person, place, and time.  Psychiatric:        Mood and Affect: Mood normal.        Behavior: Behavior normal.        Thought Content: Thought content normal.        Judgment: Judgment normal.     Last depression screening scores   Row Labels 11/07/2021    4:17 PM 08/02/2021    3:42 PM 07/12/2021    9:01 AM  PHQ 2/9 Scores   Section Header. No data exists in this row.     PHQ - 2 Score   1 0 0  PHQ- 9 Score   _0 Last fall risk screening   Row Labels 02/16/2022    2:14 PM  Fall Risk    Section Header. No data exists in this row.   Falls in the past year?   0    Assessment & Plan    1. Encounter for general adult medical examination with abnormal findings Annual physical today   2. Acquired hypothyroidism Thyroid panel stable. Continue levothyroxine 25 mcg daily.   3. Tinnitus of both ears No abnormal findings during today's exam. Refer to ENT for further evaluation and treatment.  - Ambulatory referral to Audiology     There is no immunization history on file for this patient.  Health Maintenance  Topic Date Due   DTaP/Tdap/Td (1 - Tdap) Never done   COVID-19 Vaccine (1) 05/28/2022 (Originally 04/27/1976)   Hepatitis C Screening  08/02/2022 (Originally 10/26/1994)   HIV Screening  08/02/2022 (Originally 10/26/1991)   INFLUENZA VACCINE  09/24/2022 (Originally 01/24/2022)   Fecal DNA (Cologuard)  11/23/2024   HPV VACCINES  Aged Out     Discussed health benefits of physical activity, and encouraged him to engage in regular exercise appropriate for his age and condition.  Problem List Items Addressed This Visit       Endocrine   Hypothyroidism     Other   Tinnitus of both ears   Relevant Orders   Ambulatory referral to Audiology   Other Visit Diagnoses     Encounter for general adult medical examination with abnormal findings    -  Primary        Return in about 1 year (around 05/13/2023) for health maintenance exam, FBW a week prior to visit.        Ronnell Freshwater, NP  [facility] at [facility] [phone] (phone) [phone] (fax)  [place]

## 2022-05-27 DIAGNOSIS — H9313 Tinnitus, bilateral: Secondary | ICD-10-CM | POA: Insufficient documentation

## 2022-06-02 ENCOUNTER — Ambulatory Visit: Payer: Commercial Managed Care - HMO | Attending: Audiologist | Admitting: Audiologist

## 2022-06-02 DIAGNOSIS — H903 Sensorineural hearing loss, bilateral: Secondary | ICD-10-CM | POA: Insufficient documentation

## 2022-06-02 DIAGNOSIS — H9313 Tinnitus, bilateral: Secondary | ICD-10-CM | POA: Diagnosis present

## 2022-06-05 NOTE — Procedures (Signed)
Outpatient Audiology and [facility] [address] [place], [place]  [address] [phone]  AUDIOLOGICAL  EVALUATION  NAME: Matthew Flynn     DOB:   02-18-1976      MRN: [id number]                                                                                     DATE: 06/05/2022     REFERENT: Carlean Jews, NP STATUS: Outpatient DIAGNOSIS: Mild Hearing Loss Bilaterally     History: Matthew Flynn was seen for an audiological evaluation. Matthew Flynn was accompanied to the appointment by his mother.  Matthew Flynn is receiving a hearing evaluation due to concerns for difficulty hearing and ringing in the ears. 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 This difficulty began suddenly getting worse after a wedding two months ago. 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 Ever since he has had ringing in the ears. No pain or pressure reported in either ear. Tinnitus present in both ears sounding like the static on an old TV. 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 .  Medical history negative for a condition which is a risk factor for hearing loss. No other relevant case history reported.   Evaluation:  Otoscopy showed a clear view of the tympanic membranes, bilaterally Tympanometry results were consistent with normal middle ear pressure, bilaterally   Audiometric testing was completed using conventional audiometry with insert and supraural transducer. Speech Recognition Thresholds were 30dB in the right ear and 25dB in the left ear. Word Recognition was performed 40dB SL, scored 100% in the right ear and 100% in the left ear. Pure tone thresholds show mild sensorineural hearing loss in each ear 500-2kHz. QuickSIN of 1.5dB SNR, showing normal speech understanding in noise.  Tinnitus matched to [facility] NBN tone at 28dB. Rated this a 5 on a scale of 1-10 due to his tinnitus having different static sound. Tinnitus likely  very high pitched above 8kHz, and would be nest managed by low frequency steady state sound such as pink or brown noise.    Results:  The test results were reviewed with Matthew Flynn. He was counseled on tinnitus management. Matthew Flynn should use a low frequency weighted steady state noise such as Brown noise to mask tinnitus and provide relief at night. He was counseled on nature and degree of his mild hearing loss and how to prevent further damage from noise. Matthew Flynn was given a packet of information on tinnitus and masking therapy recommendations. Recommended he get custom fit earplugs made by the [facility]. He does not want to use over the counter earplugs.   Recommendations: Recommended he get custom fit earplugs made by the [facility]. Have hearing tested every other year minimum due to history of excessive noise exposure.  Use of Bose Sleep Buds or the MyNoise App is recommended to mask tinnitus when needed.  Use of masker should be at night and when in quiet environments where the tinnitus is loud or when around triggering sound. Masker should be played at lowest  level possible that provides relief from tinnitus.   45 minutes spent testing and counseling on results.   Ammie Ferrier  Audiologist, Au.D., CCC-A 06/05/2022  10:19 AM  Cc: Carlean Jews, NP

## 2022-11-29 ENCOUNTER — Other Ambulatory Visit: Payer: Self-pay | Admitting: Nurse Practitioner

## 2022-11-29 DIAGNOSIS — E039 Hypothyroidism, unspecified: Secondary | ICD-10-CM

## 2023-03-02 IMAGING — US US SCROTUM
1 series · 14 of 25 positions shown · non-contrast
Comparison: None.

CLINICAL DATA: Left inguinal hernia

EXAM:
ULTRASOUND OF SCROTUM
TECHNIQUE: Complete ultrasound examination of the testicles, epididymis, and
other scrotal structures was performed.

[Series 1: us scrotum · 0.07mm/px · 14 of 35 slices shown]
[im 1/35]
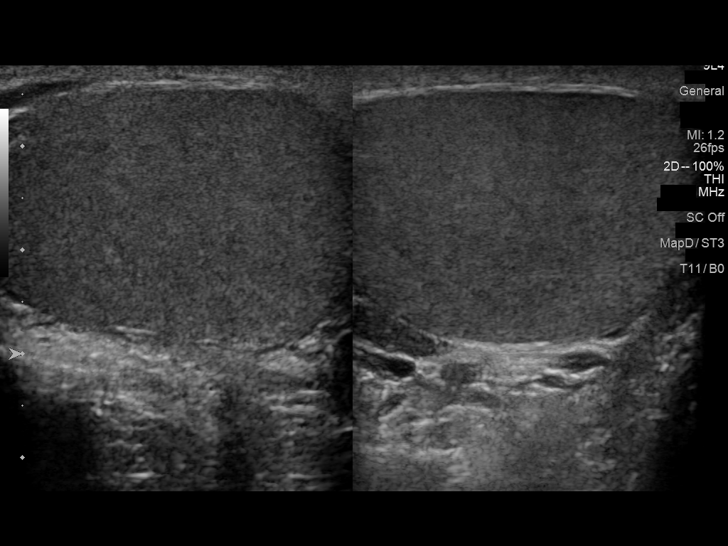
[im 3/35]
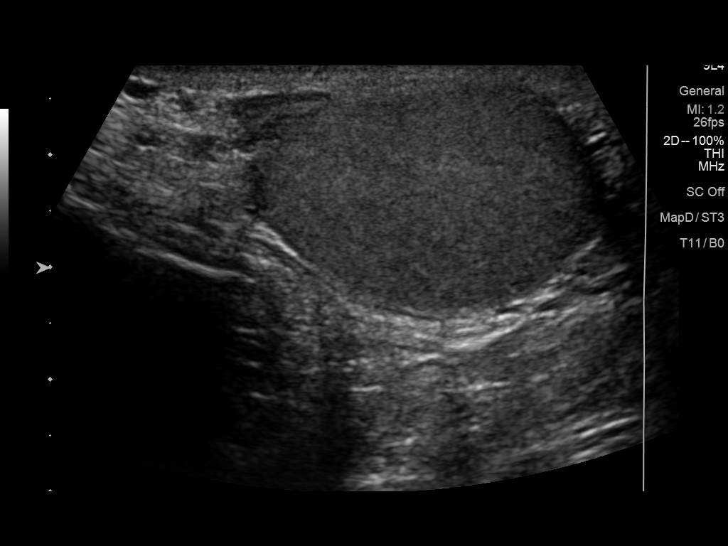
[im 6/35]
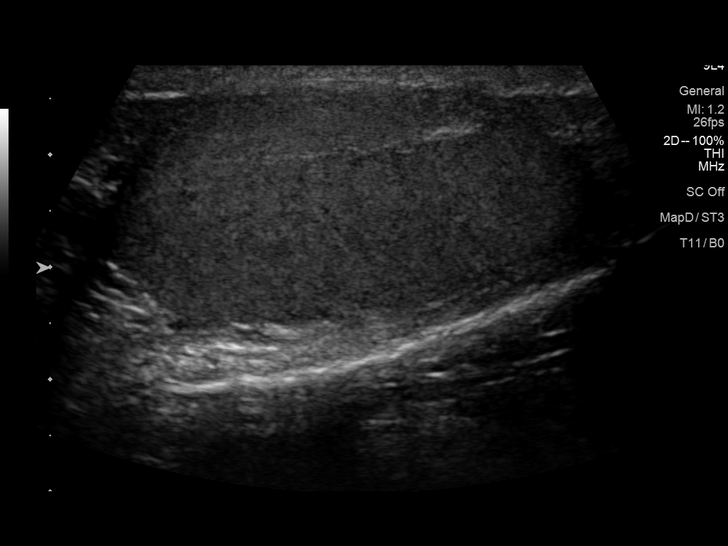
[im 9/35]
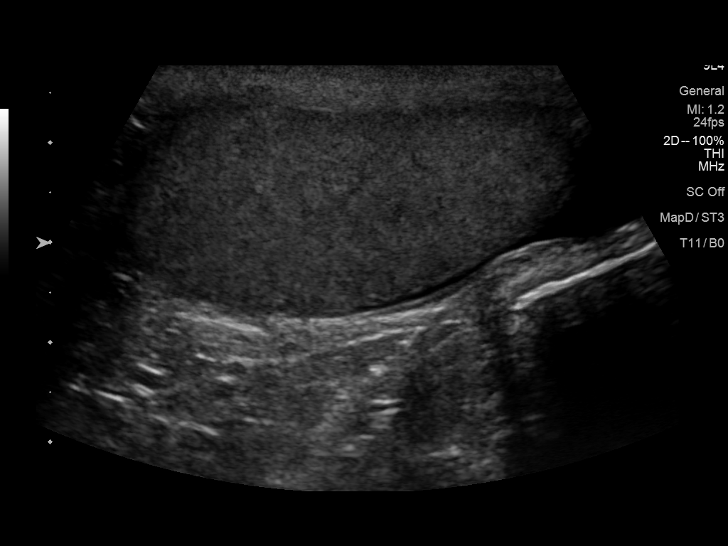
[im 12/35]
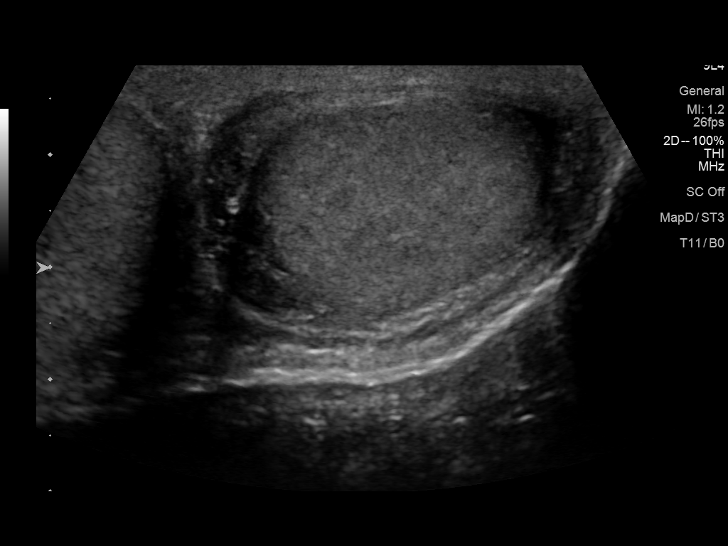
[im 13/35]
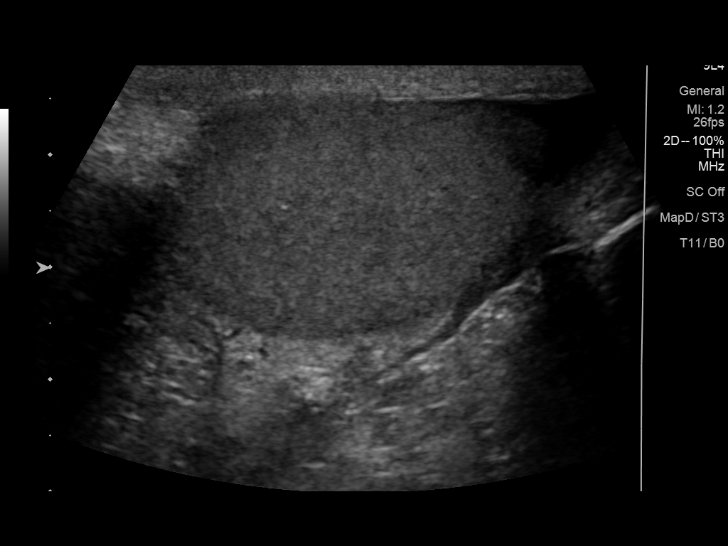
[im 16/35]
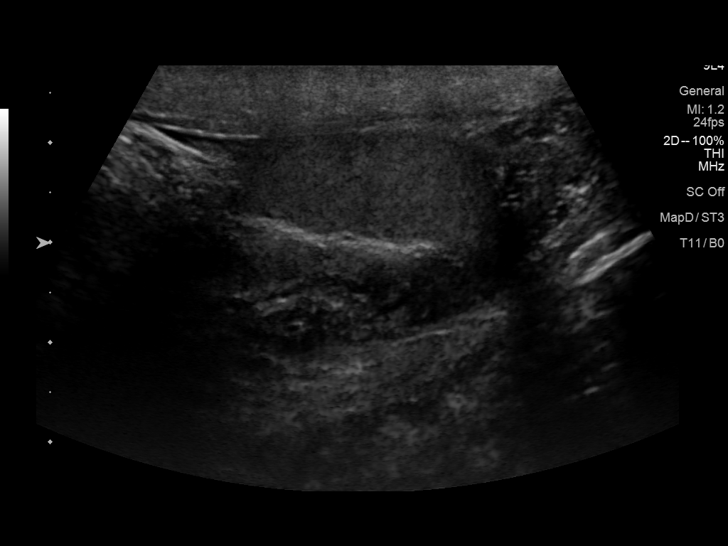
[im 19/35]
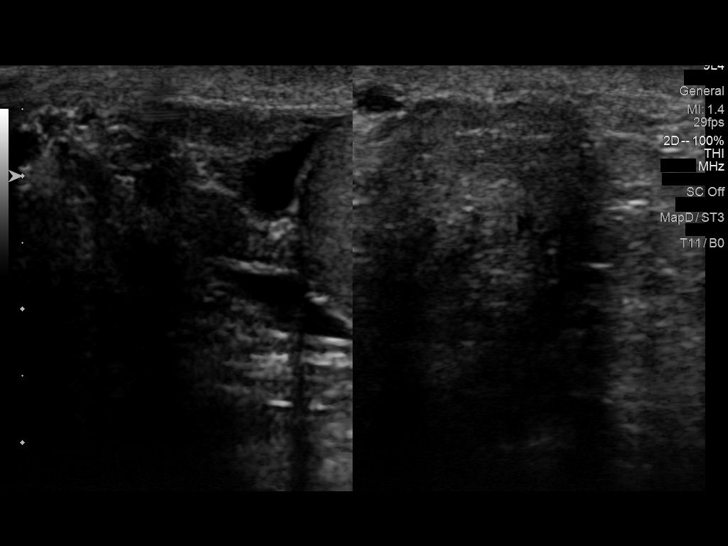
[im 22/35]
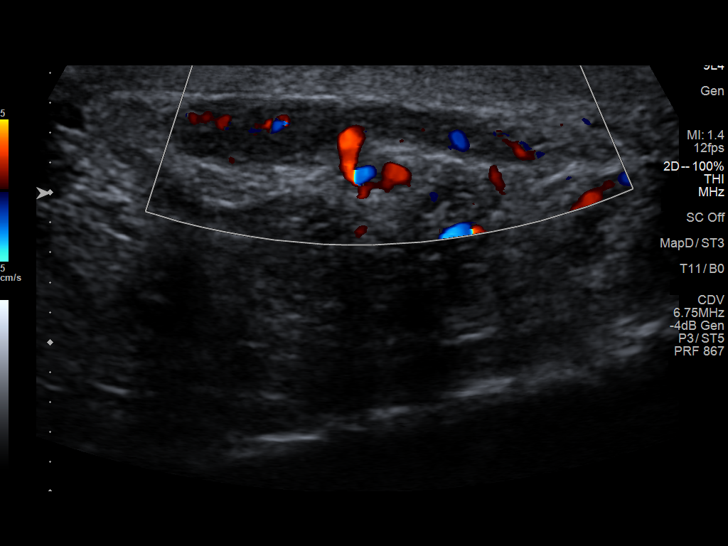
[im 23/35]
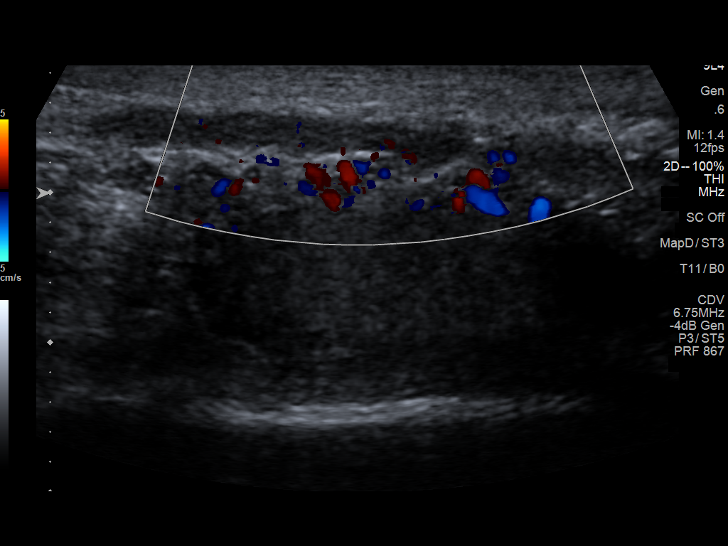
[im 26/35]
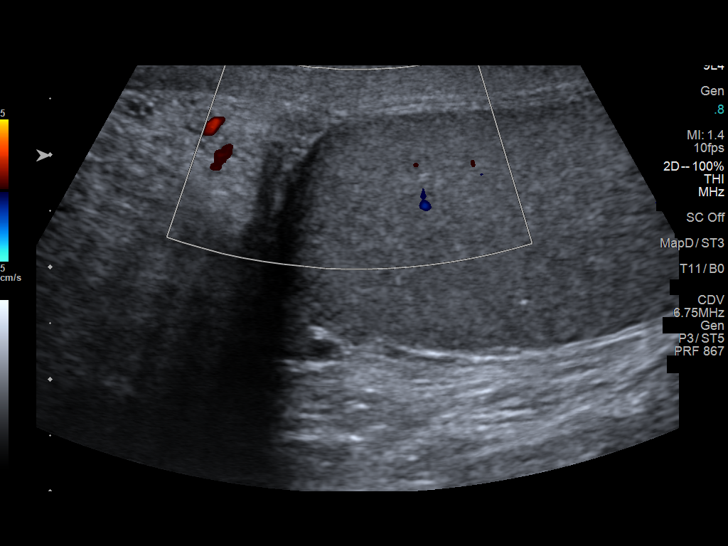
[im 29/35]
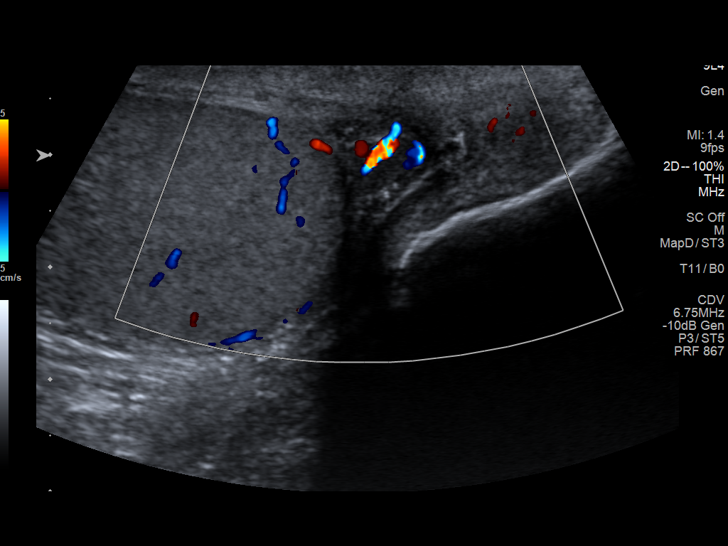
[im 32/35]
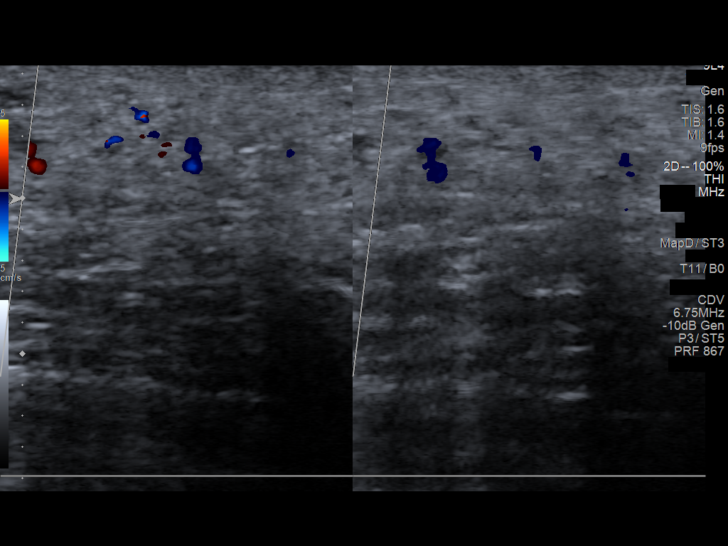
[im 35/35]
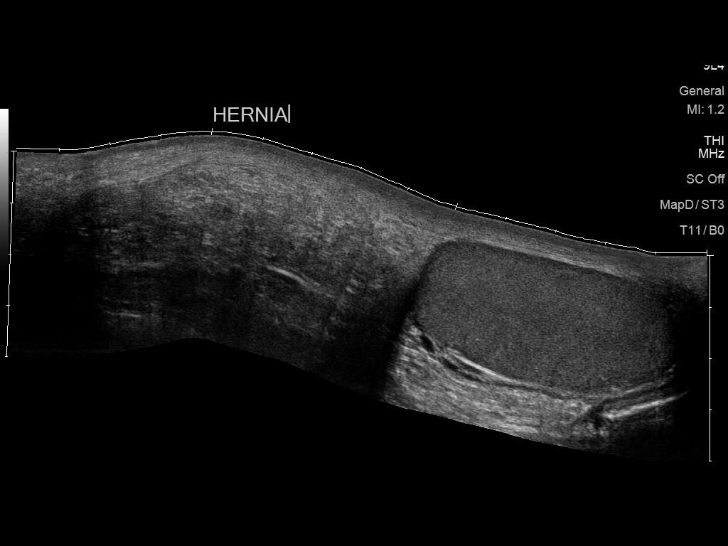

[14 of 25 positions shown; findings below may reference images not displayed]

FINDINGS: Right testicle

Measurements: 5.6 x 2.5 x 3.5 cm. No mass or microlithiasis
visualized.

Left testicle

Measurements: 5.5 x 2.4 x 3.6 cm. No mass or microlithiasis
visualized.

Right epididymis:  Normal in size and appearance.

Left epididymis:  Normal in size and appearance.

Hydrocele:  Minimal bilateral hydroceles.

Varicocele:  None visualized.
IMPRESSION: 1. No acute abnormality of the testes.
2. Large fat containing left inguinal hernia extends to the superior
scrotum in corresponds to the palpable abnormality in the left
inguinal region.

## 2023-03-02 IMAGING — US US PELVIS LIMITED
1 series · 11 of 11 positions shown · non-contrast
Comparison: None.

CLINICAL DATA: Left inguinal hernia

EXAM:
LIMITED ULTRASOUND OF PELVIS
TECHNIQUE: Limited transabdominal ultrasound examination of the pelvis was
performed.

[Series 1: us pelvis limited · 11 acquisitions, 11 frames shown]
[im 1/11]
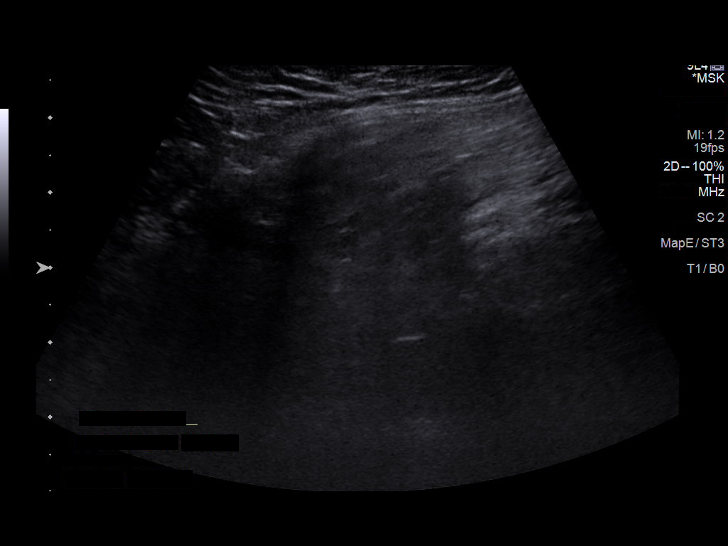
[im 2/11]
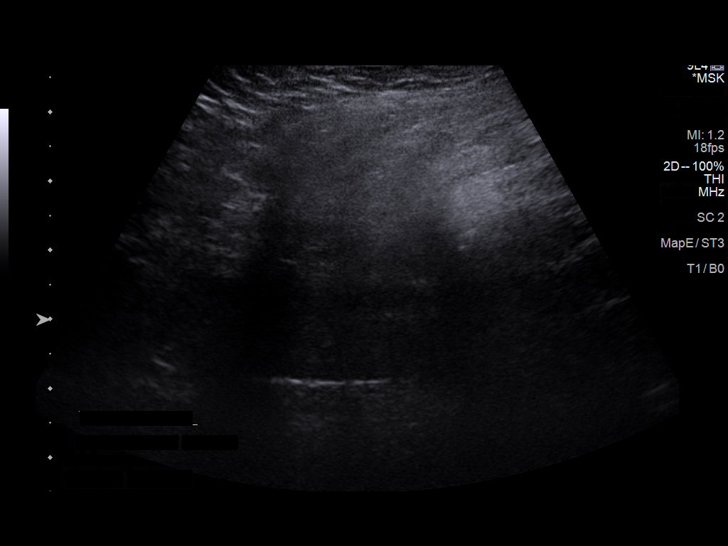
[im 3/11]
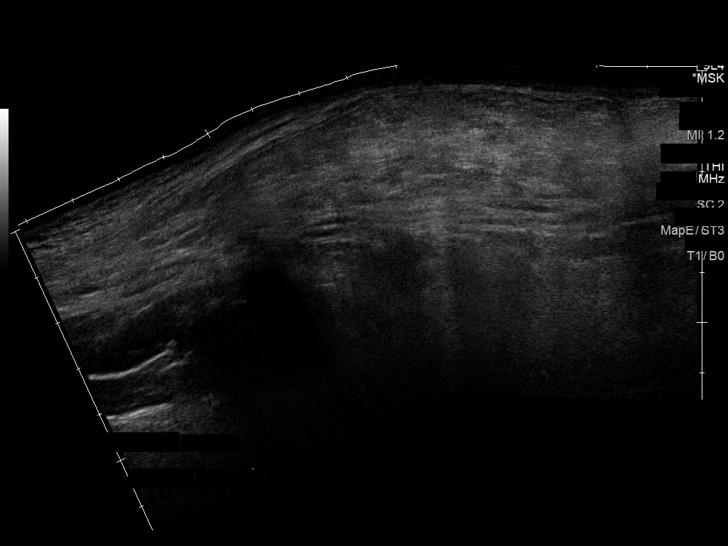
[im 4/11]
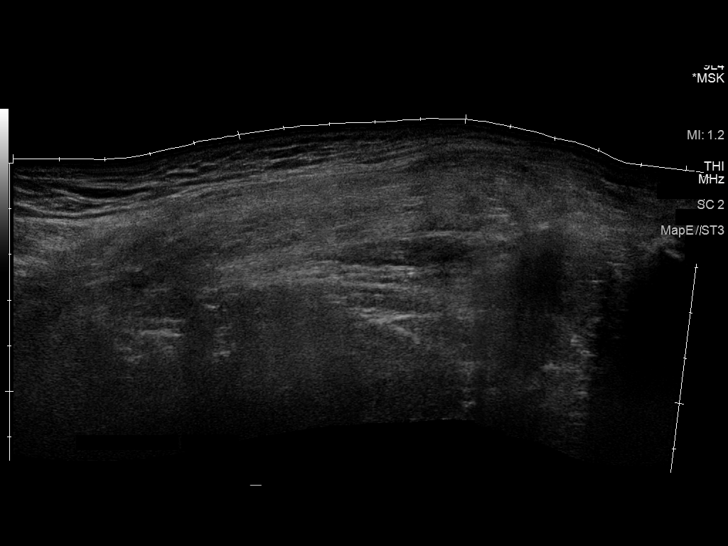
[im 5/11]
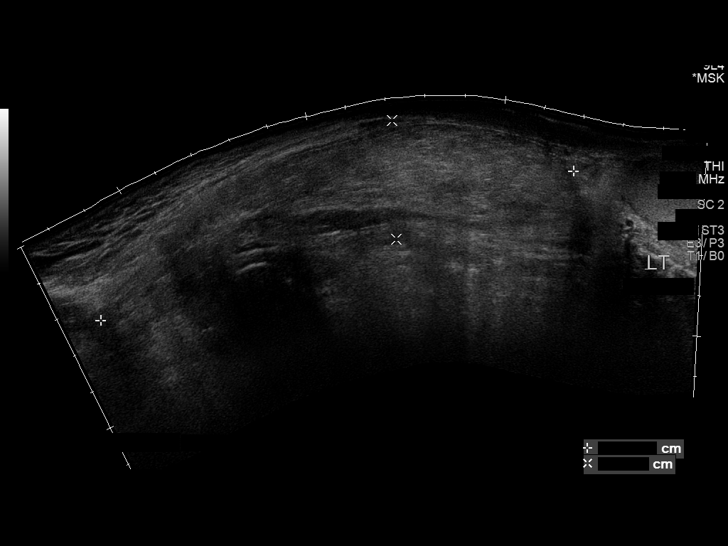
[im 6/11]
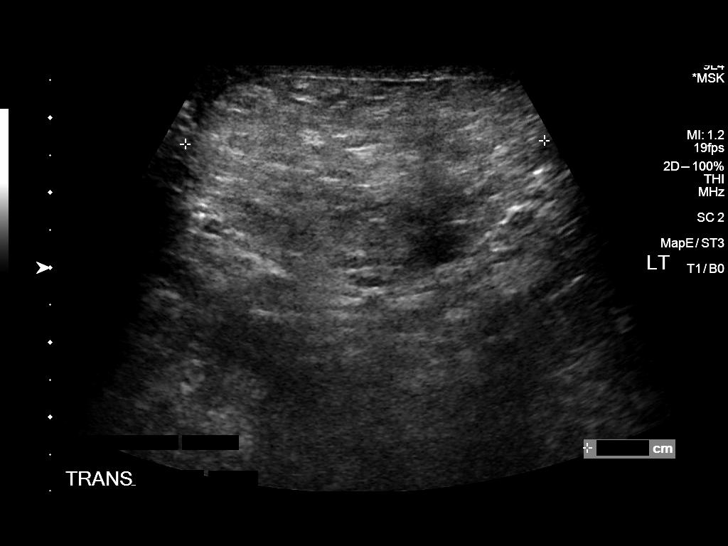
[im 7/11]
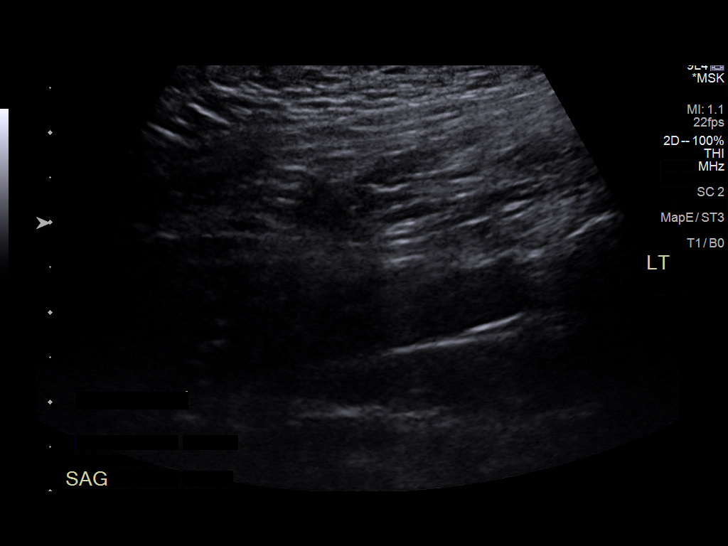
[im 8/11]
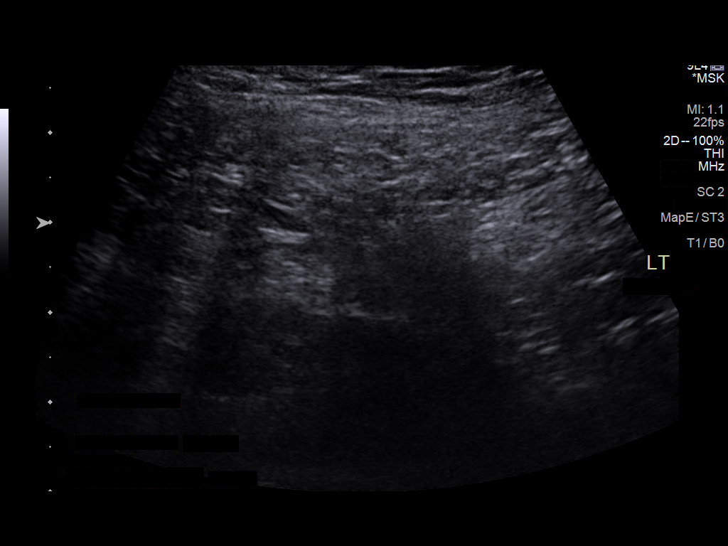
[im 9/11]
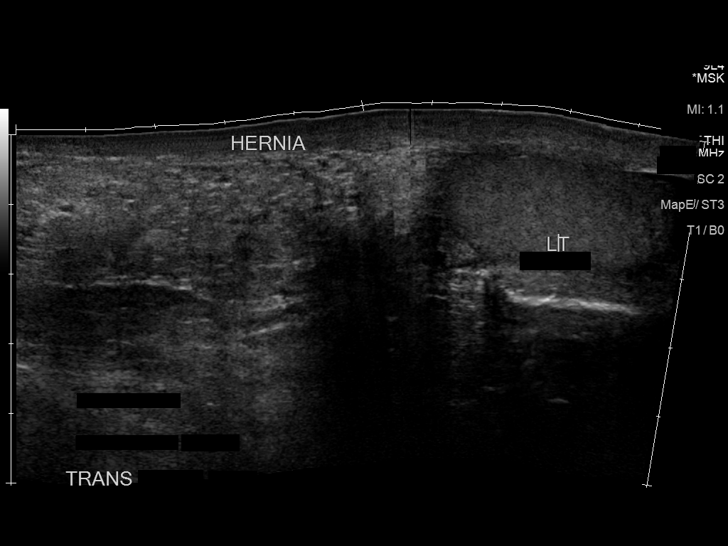
[im 10/11]
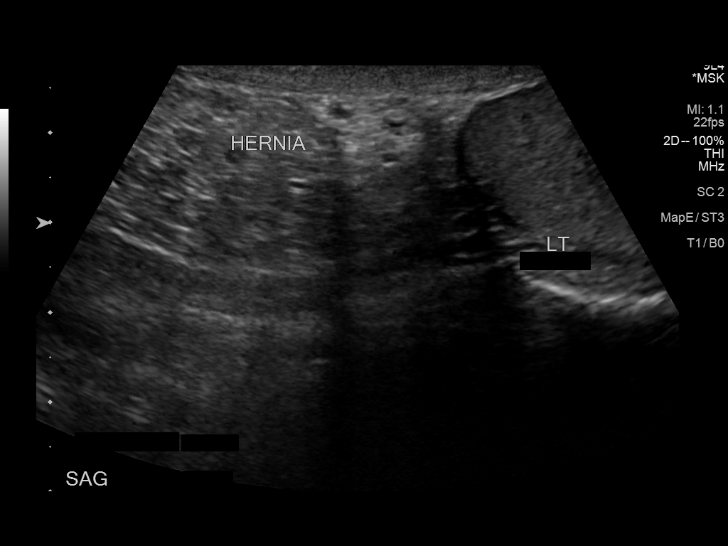
[im 11/11]
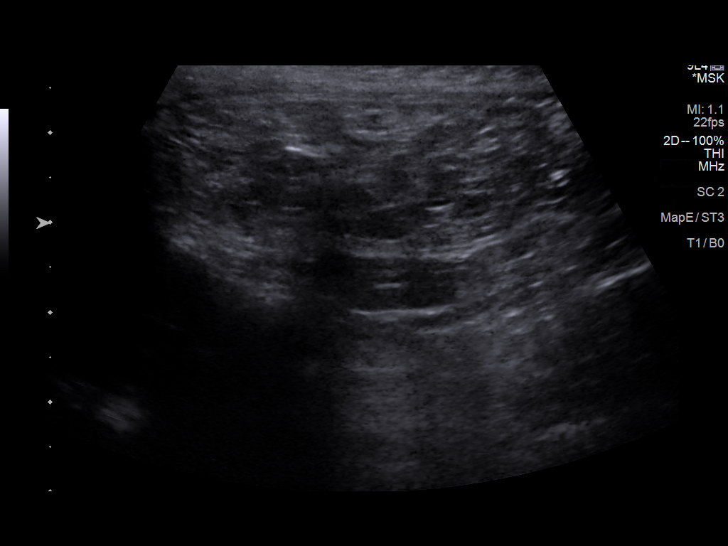

[11 of 11 positions shown; findings below may reference images not displayed]

FINDINGS: Sonographic evaluation of the left inguinal region demonstrates a
large fat containing hernia extending to the superior scrotum.
IMPRESSION: Large fat containing left inguinal hernia extends to the superior
scrotum and corresponds to the palpable abnormality in the left
inguinal region.

## 2023-04-20 ENCOUNTER — Other Ambulatory Visit: Payer: Self-pay | Admitting: Family Medicine

## 2023-04-20 ENCOUNTER — Other Ambulatory Visit: Payer: Self-pay | Admitting: Nurse Practitioner

## 2023-04-20 DIAGNOSIS — E039 Hypothyroidism, unspecified: Secondary | ICD-10-CM

## 2023-04-26 ENCOUNTER — Other Ambulatory Visit: Payer: Self-pay

## 2023-04-26 DIAGNOSIS — Z1159 Encounter for screening for other viral diseases: Secondary | ICD-10-CM

## 2023-04-26 DIAGNOSIS — E039 Hypothyroidism, unspecified: Secondary | ICD-10-CM

## 2023-04-26 DIAGNOSIS — Z0000 Encounter for general adult medical examination without abnormal findings: Secondary | ICD-10-CM

## 2023-05-07 ENCOUNTER — Other Ambulatory Visit: Payer: Managed Care, Other (non HMO)

## 2023-05-07 DIAGNOSIS — E039 Hypothyroidism, unspecified: Secondary | ICD-10-CM

## 2023-05-07 DIAGNOSIS — Z1159 Encounter for screening for other viral diseases: Secondary | ICD-10-CM

## 2023-05-07 DIAGNOSIS — Z0000 Encounter for general adult medical examination without abnormal findings: Secondary | ICD-10-CM

## 2023-05-08 LAB — CBC WITH DIFFERENTIAL/PLATELET
Basophils Absolute: 0.1 10*3/uL (ref 0.0–0.2)
Basos: 1 %
EOS (ABSOLUTE): 0.3 10*3/uL (ref 0.0–0.4)
Eos: 3 %
Hematocrit: 50.3 % (ref 37.5–51.0)
Hemoglobin: 16.3 g/dL (ref 13.0–17.7)
Immature Grans (Abs): 0 10*3/uL (ref 0.0–0.1)
Immature Granulocytes: 0 %
Lymphocytes Absolute: 3.2 10*3/uL — ABNORMAL HIGH (ref 0.7–3.1)
Lymphs: 30 %
MCH: 31.2 pg (ref 26.6–33.0)
MCHC: 32.4 g/dL (ref 31.5–35.7)
MCV: 96 fL (ref 79–97)
Monocytes Absolute: 0.6 10*3/uL (ref 0.1–0.9)
Monocytes: 5 %
Neutrophils Absolute: 6.5 10*3/uL (ref 1.4–7.0)
Neutrophils: 61 %
Platelets: 253 10*3/uL (ref 150–450)
RBC: 5.23 x10E6/uL (ref 4.14–5.80)
RDW: 12.6 % (ref 11.6–15.4)
WBC: 10.7 10*3/uL (ref 3.4–10.8)

## 2023-05-08 LAB — HEPATITIS C ANTIBODY: Hep C Virus Ab: NONREACTIVE

## 2023-05-08 LAB — TSH: TSH: 1.67 u[IU]/mL (ref 0.450–4.500)

## 2023-05-08 LAB — COMPREHENSIVE METABOLIC PANEL
ALT: 15 [IU]/L (ref 0–44)
AST: 15 [IU]/L (ref 0–40)
Albumin: 4.2 g/dL (ref 4.1–5.1)
Alkaline Phosphatase: 76 [IU]/L (ref 44–121)
BUN/Creatinine Ratio: 14 (ref 9–20)
BUN: 13 mg/dL (ref 6–24)
Bilirubin Total: 0.3 mg/dL (ref 0.0–1.2)
CO2: 24 mmol/L (ref 20–29)
Calcium: 9.5 mg/dL (ref 8.7–10.2)
Chloride: 103 mmol/L (ref 96–106)
Creatinine, Ser: 0.94 mg/dL (ref 0.76–1.27)
Globulin, Total: 2.1 g/dL (ref 1.5–4.5)
Glucose: 92 mg/dL (ref 70–99)
Potassium: 4.4 mmol/L (ref 3.5–5.2)
Sodium: 141 mmol/L (ref 134–144)
Total Protein: 6.3 g/dL (ref 6.0–8.5)
eGFR: 101 mL/min/{1.73_m2} (ref >59)

## 2023-05-08 LAB — LIPID PANEL
Chol/HDL Ratio: 2.9 ratio (ref 0.0–5.0)
Cholesterol, Total: 129 mg/dL (ref 100–199)
HDL: 45 mg/dL (ref >39)
LDL Chol Calc (NIH): 66 mg/dL (ref 0–99)
Triglycerides: 93 mg/dL (ref 0–149)
VLDL Cholesterol Cal: 18 mg/dL (ref 5–40)

## 2023-05-08 LAB — HEMOGLOBIN A1C
Est. average glucose Bld gHb Est-mCnc: 114 mg/dL
Hgb A1c MFr Bld: 5.6 % (ref 4.8–5.6)

## 2023-05-14 ENCOUNTER — Encounter: Payer: Commercial Managed Care - HMO | Admitting: Family Medicine

## 2023-05-14 ENCOUNTER — Encounter: Payer: Commercial Managed Care - HMO | Admitting: Nurse Practitioner

## 2023-05-14 NOTE — Progress Notes (Deleted)
   Annual physical  Subjective   Patient ID: Matthew Flynn, male    DOB: July 14, 1975  Age: 47 y.o. MRN: [id number]  No chief complaint on file.  HPI Matthew Flynn is a 47 y.o. old male here  for annual exam.   Changes in his/her health in the last 12 months: {yes/no:63}  The patient currently works as a***/does not work***.  He is married/single/in a relationship and recently/not recently sexually active with ***male partner.  He does*** use tobacco***, drinks***days per***, does***use recreational drugs.  The patient eats a***diet.  He exercises***times per week doing***.  The patient does***have an advanced directive.  The patient declines/endorses concerns for impaired hearing.  The patient declines/endorses concerns for impaired vision.  The patient does/does not see a dentist regularly.  The patient does***have a family history of prostate cancer.  The patient does***have a family history of colon cancer.  The patient has the following chronic health issues that are monitored on a routine basis: ***  Labs were normal  Is he taking Synthroid?  Hyperprolactinemia-repeat prolactin level?  HPI  Separate, acute concerns today: ***  The ASCVD Risk score (Arnett DK, et al., 2019) failed to calculate for the following reasons:   The systolic blood pressure is missing   The valid total cholesterol range is 130 to 320 mg/dL  Health Maintenance Due  Topic Date Due   HIV Screening  Never done   DTaP/Tdap/Td (1 - Tdap) Never done   INFLUENZA VACCINE  Never done   COVID-19 Vaccine (1 - 2023-24 season) Never done      Objective:     There were no vitals taken for this visit. {Vitals History (Optional):23777}  Physical Exam   No results found for any visits on 05/14/23.      Assessment & Plan:   There are no diagnoses linked to this encounter.   No follow-ups on file.    Sandre Kitty, MD

## 2023-09-05 ENCOUNTER — Other Ambulatory Visit: Payer: Self-pay | Admitting: Family Medicine

## 2023-09-05 DIAGNOSIS — E039 Hypothyroidism, unspecified: Secondary | ICD-10-CM

## 2024-03-06 ENCOUNTER — Other Ambulatory Visit: Payer: Self-pay | Admitting: Family Medicine

## 2024-03-06 DIAGNOSIS — E039 Hypothyroidism, unspecified: Secondary | ICD-10-CM
